# Patient Record
Sex: Female | Born: 1990 | Race: Black or African American | Hispanic: No | Marital: Single | Smoking: Never smoker
Health system: Southern US, Community
[De-identification: ages and names within clinical notes are randomized; demographics above are authoritative.]

## PROBLEM LIST (undated history)

## (undated) ENCOUNTER — Inpatient Hospital Stay (HOSPITAL_COMMUNITY): Payer: Self-pay

## (undated) ENCOUNTER — Emergency Department (HOSPITAL_BASED_OUTPATIENT_CLINIC_OR_DEPARTMENT_OTHER): Admission: EM | Payer: Medicaid Other | Source: Home / Self Care

## (undated) DIAGNOSIS — A749 Chlamydial infection, unspecified: Secondary | ICD-10-CM

## (undated) DIAGNOSIS — D649 Anemia, unspecified: Secondary | ICD-10-CM

## (undated) HISTORY — PX: TIBIA FRACTURE SURGERY: SHX806

---

## 2011-07-15 ENCOUNTER — Emergency Department (INDEPENDENT_AMBULATORY_CARE_PROVIDER_SITE_OTHER): Payer: Self-pay

## 2011-07-15 ENCOUNTER — Encounter: Payer: Self-pay | Admitting: *Deleted

## 2011-07-15 ENCOUNTER — Emergency Department (HOSPITAL_BASED_OUTPATIENT_CLINIC_OR_DEPARTMENT_OTHER)
Admission: EM | Admit: 2011-07-15 | Discharge: 2011-07-15 | Disposition: A | Discharge status: Home / Self Care | Payer: Self-pay | Attending: Emergency Medicine | Admitting: Emergency Medicine

## 2011-07-15 DIAGNOSIS — R1032 Left lower quadrant pain: Secondary | ICD-10-CM

## 2011-07-15 DIAGNOSIS — N83209 Unspecified ovarian cyst, unspecified side: Secondary | ICD-10-CM | POA: Insufficient documentation

## 2011-07-15 DIAGNOSIS — R110 Nausea: Secondary | ICD-10-CM

## 2011-07-15 DIAGNOSIS — N390 Urinary tract infection, site not specified: Secondary | ICD-10-CM | POA: Insufficient documentation

## 2011-07-15 HISTORY — DX: Anemia, unspecified: D64.9

## 2011-07-15 LAB — URINE MICROSCOPIC-ADD ON

## 2011-07-15 LAB — URINALYSIS, ROUTINE W REFLEX MICROSCOPIC
Bilirubin Urine: NEGATIVE
Ketones, ur: NEGATIVE mg/dL
Nitrite: NEGATIVE
Protein, ur: NEGATIVE mg/dL
pH: 6.5 (ref 5.0–8.0)

## 2011-07-15 MED ORDER — SULFAMETHOXAZOLE-TRIMETHOPRIM 800-160 MG PO TABS: 1.0000 | ORAL_TABLET | Freq: Two times a day (BID) | ORAL | Status: AC

## 2011-07-15 MED ORDER — HYDROCODONE-ACETAMINOPHEN 5-500 MG PO TABS: 1.0000 | ORAL_TABLET | Freq: Four times a day (QID) | ORAL | Status: AC | PRN

## 2011-07-15 NOTE — ED Notes (Signed)
Pt c/o generalized abd pain and nausea x 1 week.

## 2011-07-19 ENCOUNTER — Telehealth (HOSPITAL_BASED_OUTPATIENT_CLINIC_OR_DEPARTMENT_OTHER): Payer: Self-pay | Admitting: Emergency Medicine

## 2011-08-11 NOTE — ED Provider Notes (Signed)
History     CSN: [id number] Arrival date & time: 07/15/2011  2:00 PM   None     Chief Complaint  Patient presents with  . Abdominal Pain    (Consider location/radiation/quality/duration/timing/severity/associated sxs/prior treatment) Patient is a 20 y.o. female presenting with abdominal pain. The history is provided by the patient.  Abdominal Pain The primary symptoms of the illness include abdominal pain. The primary symptoms of the illness do not include fever, nausea, vomiting or diarrhea. The current episode started more than 2 days ago. The onset of the illness was gradual. The problem has been gradually worsening.  The patient states that she believes she is currently not pregnant. The patient has not had a change in bowel habit. Symptoms associated with the illness do not include chills, diaphoresis or constipation. Significant associated medical issues do not include PUD, inflammatory bowel disease or diabetes.    Past Medical History  Diagnosis Date  . Anemia     Past Surgical History  Procedure Date  . Joint replacement     History reviewed. No pertinent family history.  History  Substance Use Topics  . Smoking status: Never Smoker   . Smokeless tobacco: Not on file  . Alcohol Use: No    OB History    Grav Para Term Preterm Abortions TAB SAB Ect Mult Living                  Review of Systems  Constitutional: Negative for fever, chills and diaphoresis.  Gastrointestinal: Positive for abdominal pain. Negative for nausea, vomiting, diarrhea and constipation.  All other systems reviewed and are negative.    Allergies  Review of patient's allergies indicates no known allergies.  Home Medications  No current outpatient prescriptions on file.  BP 119/84  Pulse 83  Temp(Src) 98.6 F (37 C) (Oral)  Resp 16  Ht 5\' 8"  (1.727 m)  Wt 175 lb (79.379 kg)  BMI 26.61 kg/m2  SpO2 100%  LMP 06/20/2011  Physical Exam  Nursing note and vitals  reviewed. Constitutional: She is oriented to person, place, and time. She appears well-developed and well-nourished. No distress.  HENT:  Head: Normocephalic and atraumatic.  Neck: Normal range of motion. Neck supple.  Cardiovascular: Normal rate and regular rhythm.  Exam reveals no gallop and no friction rub.   No murmur heard. Pulmonary/Chest: Effort normal and breath sounds normal. No respiratory distress. She has no wheezes.  Abdominal: Soft. Bowel sounds are normal. There is tenderness. There is no rebound and no guarding.  Musculoskeletal: Normal range of motion.  Neurological: She is alert and oriented to person, place, and time.  Skin: Skin is warm and dry. She is not diaphoretic.    ED Course  Procedures (including critical care time)  Labs Reviewed  URINALYSIS, ROUTINE W REFLEX MICROSCOPIC - Abnormal; Notable for the following:    Appearance CLOUDY (*)    Leukocytes, UA SMALL (*)    All other components within normal limits  URINE MICROSCOPIC-ADD ON - Abnormal; Notable for the following:    Squamous Epithelial / LPF MANY (*)    Bacteria, UA MANY (*)    All other components within normal limits  PREGNANCY, URINE   No results found.   1. Abdominal pain, LLQ   2. Ovarian cyst   3. UTI (urinary tract infection)       MDM  Ultrasound shows ovarian cyst, ua looks like uti.  Will discharge with pain meds, antibiotics.  Geoffery Lyons, MD 08/11/11 1328

## 2012-09-30 IMAGING — US US TRANSVAGINAL NON-OB
1 series · 13 of 25 positions shown · non-contrast
Comparison: None.

CLINICAL DATA: Left lower quadrant pain and nausea.  Mirens
inserted [DATE]

TRANSABDOMINAL ULTRASOUND OF PELVIS
TECHNIQUE: Transabdominal ultrasound examination of the pelvis was
performed including evaluation of the uterus, ovaries, adnexal
regions, and pelvic cul-de-sac.

[Series 1: us transvaginal non-ob · 0.30mm/px · 64 acquisitions, 13 frames shown]
[im 1/64]
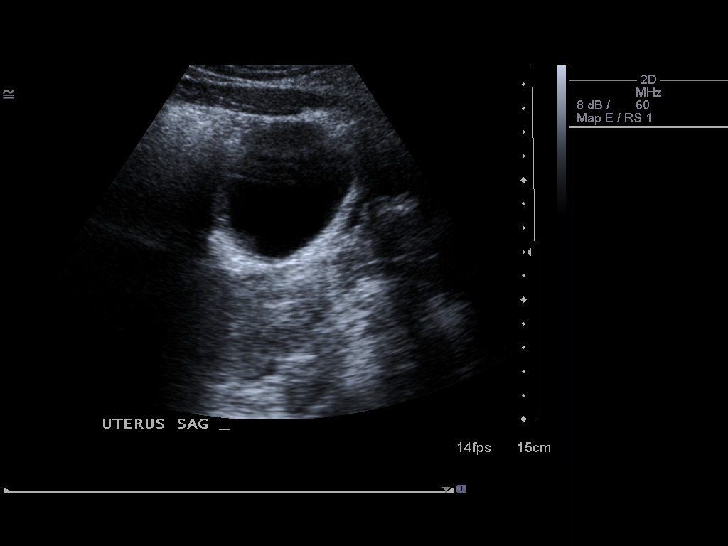
[im 6/64]
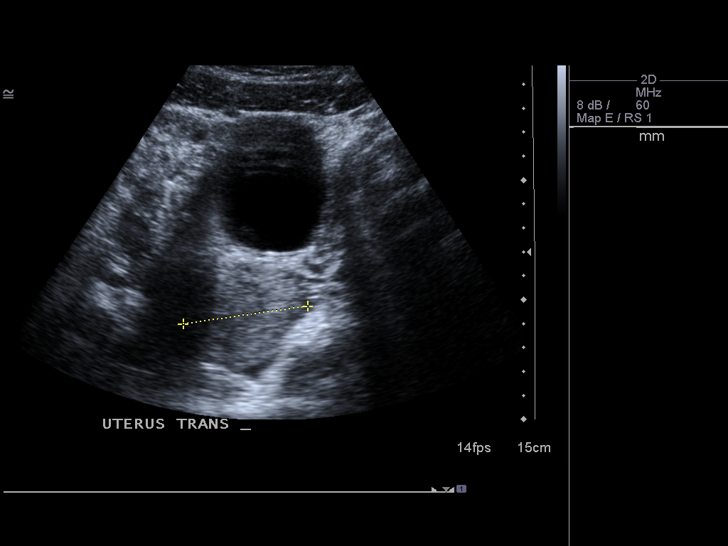
[im 11/64]
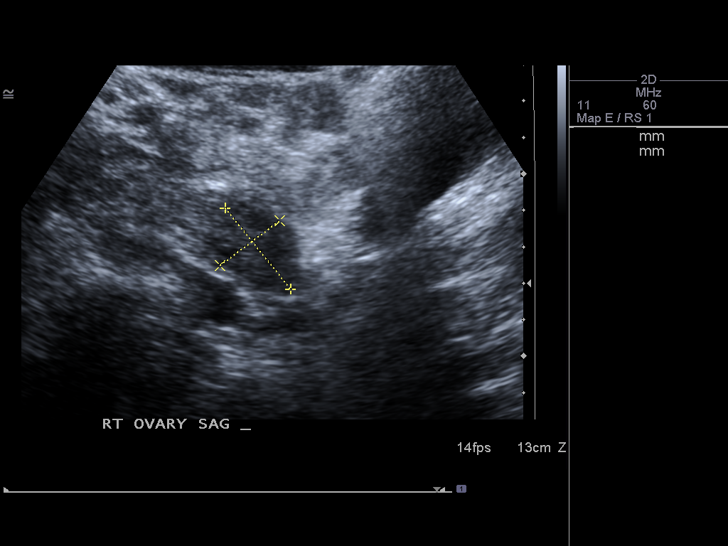
[im 16/64]
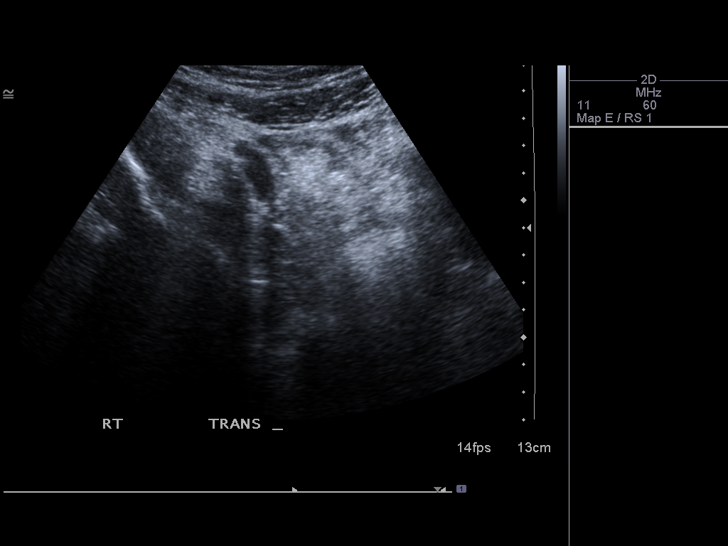
[im 22/64]
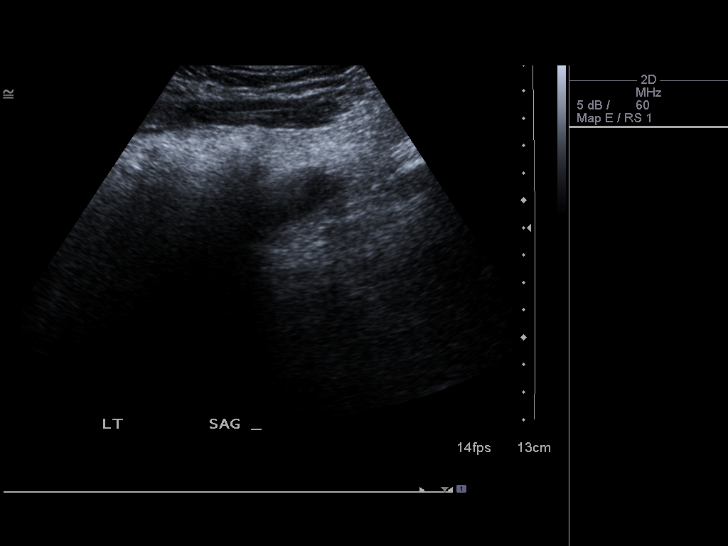
[im 27/64]
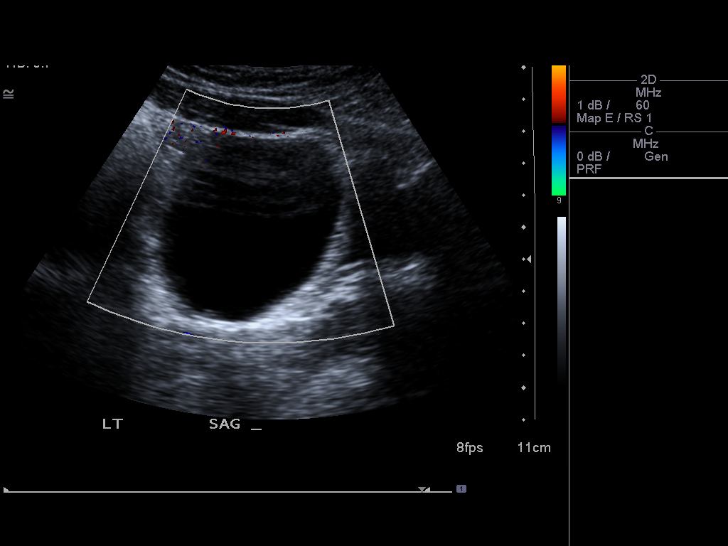
[im 32/64]
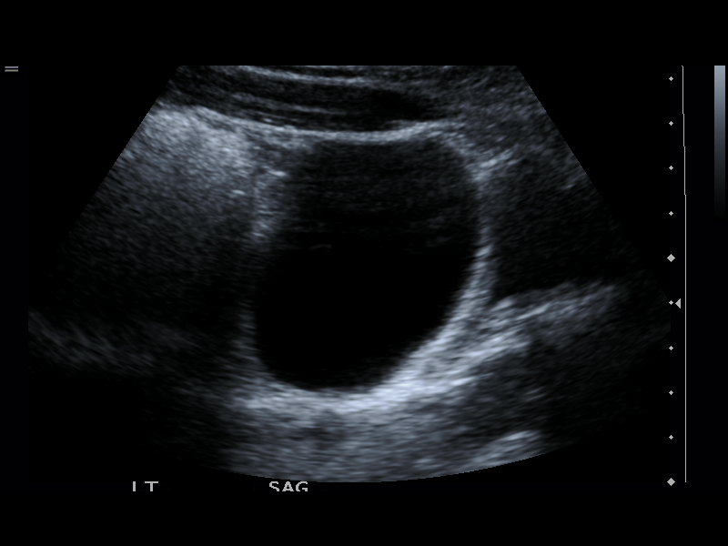
[im 37/64]
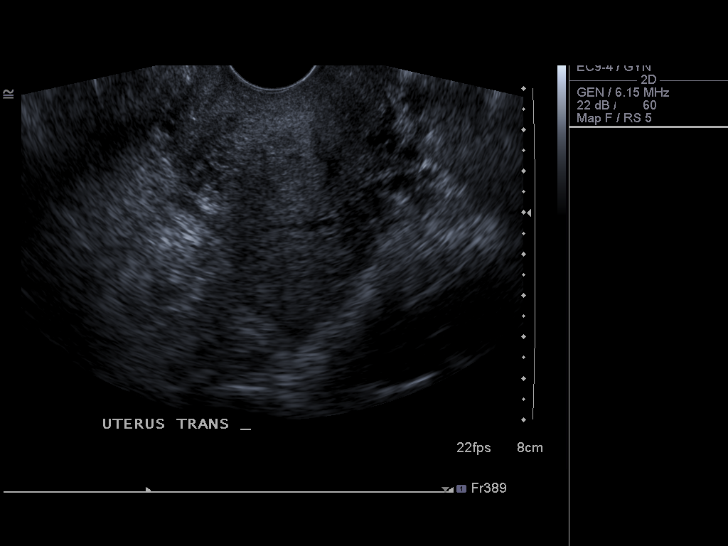
[im 43/64]
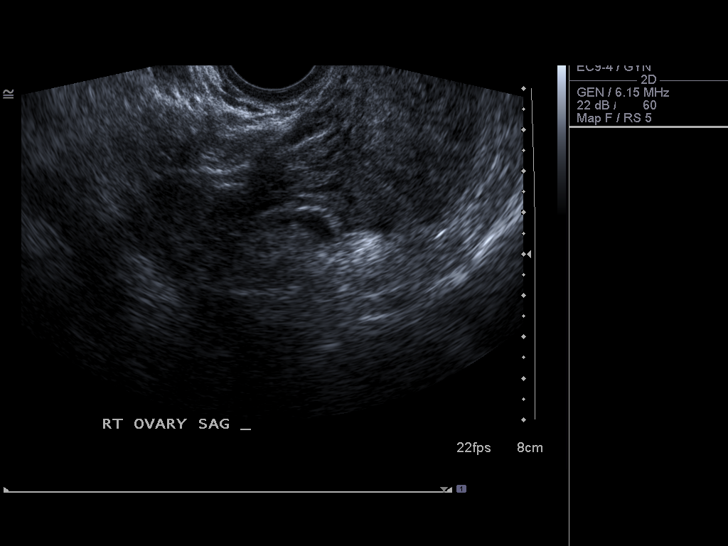
[im 48/64]
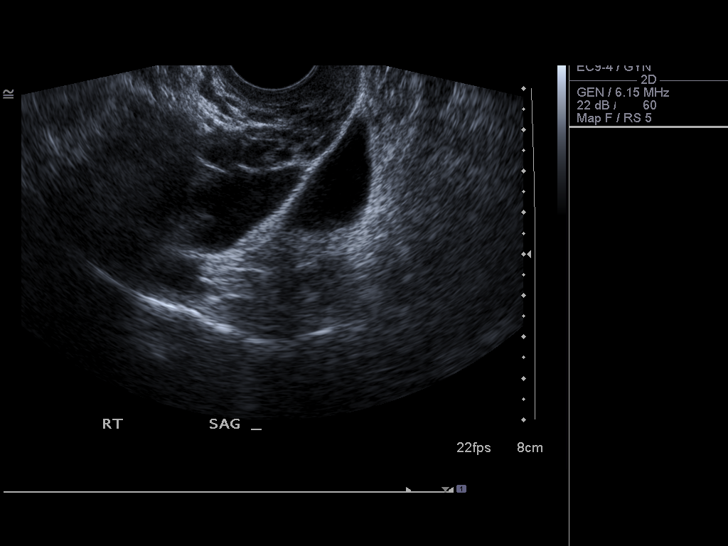
[im 53/64]
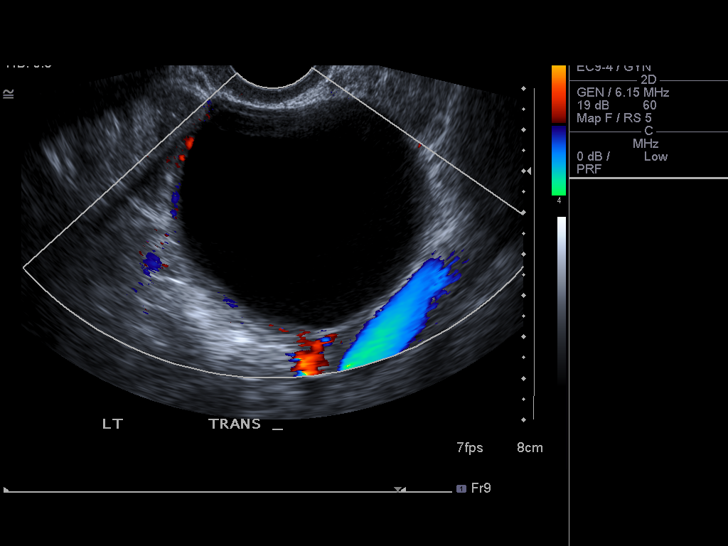
[im 58/64]
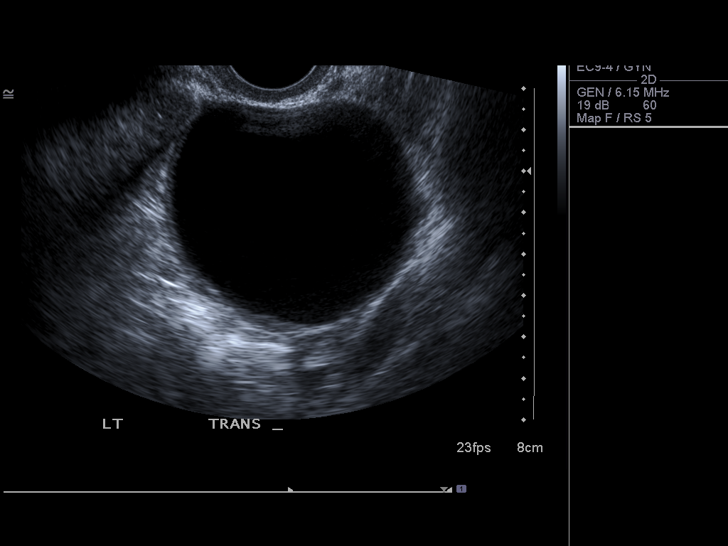
[im 64/64]
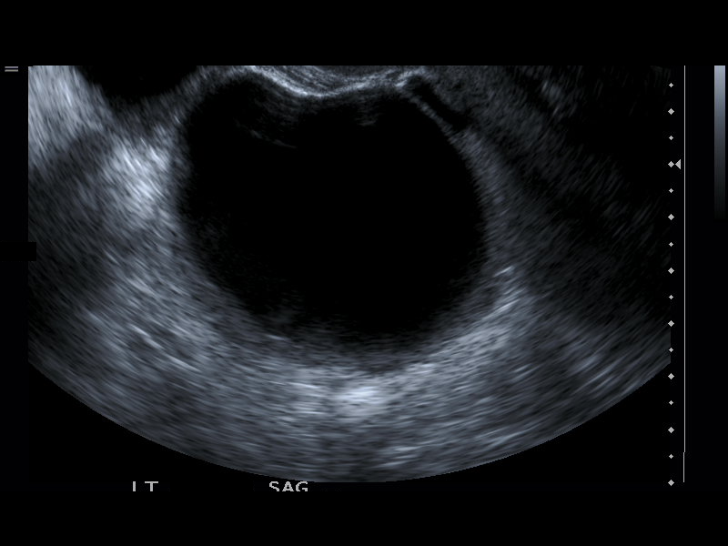

[13 of 25 positions shown; findings below may reference images not displayed]

FINDINGS: Uterus:  Uterus is retroverted measuring 7.9 x 4.2 x 5.5 cm.

Endometrium: Endometrium is normal thickness for a premenopausal
female at 14 mm.  There is an echogenic focus within the
endometrial canal which may relate to contraceptive device.

Right ovary: Normal in size and echogenicity measuring 2.9 x 2.6 x
2.2 cm.

Left ovary:  There is a large anechoic cyst cystic lesion within
the left adnexa measuring 6.5 x 6.2 x 5.4 cm.  The wall is uniform
but minimally thickened.  There is a small amount of peripheral
soft tissue which likely represents the normal ovary.  The lesion
demonstrates mild peripheral vascular flow.

Small amount of intraperitoneal free fluid.
IMPRESSION: 1.  Large benign appearing cyst associated with the left ovary
measures up to 6.5 cm. Recommend follow up imaging in 6 to 12
weeks.

2.  Echogenic focus within the endometrial canal likely represents
contraceptive device.

## 2013-11-16 ENCOUNTER — Inpatient Hospital Stay (HOSPITAL_COMMUNITY)
Admission: AD | Admit: 2013-11-16 | Discharge: 2013-11-16 | Disposition: A | Discharge status: Home / Self Care | Payer: Medicaid Other | Source: Ambulatory Visit | Attending: Obstetrics & Gynecology | Admitting: Obstetrics & Gynecology

## 2013-11-16 ENCOUNTER — Inpatient Hospital Stay (HOSPITAL_COMMUNITY): Payer: Medicaid Other

## 2013-11-16 ENCOUNTER — Encounter (HOSPITAL_COMMUNITY): Payer: Self-pay | Admitting: *Deleted

## 2013-11-16 DIAGNOSIS — O26899 Other specified pregnancy related conditions, unspecified trimester: Secondary | ICD-10-CM

## 2013-11-16 DIAGNOSIS — O99019 Anemia complicating pregnancy, unspecified trimester: Secondary | ICD-10-CM | POA: Insufficient documentation

## 2013-11-16 DIAGNOSIS — O9989 Other specified diseases and conditions complicating pregnancy, childbirth and the puerperium: Principal | ICD-10-CM

## 2013-11-16 DIAGNOSIS — R1032 Left lower quadrant pain: Secondary | ICD-10-CM | POA: Insufficient documentation

## 2013-11-16 DIAGNOSIS — R109 Unspecified abdominal pain: Secondary | ICD-10-CM

## 2013-11-16 DIAGNOSIS — D649 Anemia, unspecified: Secondary | ICD-10-CM | POA: Insufficient documentation

## 2013-11-16 DIAGNOSIS — O99891 Other specified diseases and conditions complicating pregnancy: Secondary | ICD-10-CM | POA: Insufficient documentation

## 2013-11-16 LAB — URINALYSIS, ROUTINE W REFLEX MICROSCOPIC
BILIRUBIN URINE: NEGATIVE
GLUCOSE, UA: NEGATIVE mg/dL
HGB URINE DIPSTICK: NEGATIVE
Ketones, ur: NEGATIVE mg/dL
Nitrite: NEGATIVE
Protein, ur: NEGATIVE mg/dL
UROBILINOGEN UA: 0.2 mg/dL (ref 0.0–1.0)
pH: 6 (ref 5.0–8.0)

## 2013-11-16 LAB — URINE MICROSCOPIC-ADD ON

## 2013-11-16 LAB — CBC
HEMATOCRIT: 29.7 % — AB (ref 36.0–46.0)
HEMOGLOBIN: 9.8 g/dL — AB (ref 12.0–15.0)
MCH: 24.2 pg — ABNORMAL LOW (ref 26.0–34.0)
MCHC: 33 g/dL (ref 30.0–36.0)
MCV: 73.3 fL — ABNORMAL LOW (ref 78.0–100.0)
Platelets: 202 10*3/uL (ref 150–400)
RBC: 4.05 MIL/uL (ref 3.87–5.11)
RDW: 13.9 % (ref 11.5–15.5)
WBC: 5.2 10*3/uL (ref 4.0–10.5)

## 2013-11-16 LAB — WET PREP, GENITAL
TRICH WET PREP: NONE SEEN
YEAST WET PREP: NONE SEEN

## 2013-11-16 LAB — POCT PREGNANCY, URINE: Preg Test, Ur: POSITIVE — AB

## 2013-11-16 LAB — ABO/RH: ABO/RH(D): O POS

## 2013-11-16 LAB — HCG, QUANTITATIVE, PREGNANCY: HCG, BETA CHAIN, QUANT, S: 878 m[IU]/mL — AB (ref <5)

## 2013-11-16 NOTE — MAU Note (Signed)
Pt presents with complaints of pain in her left side states it is next to her ovary. She said she started having problems when she had a mirena but had that removed a while back.

## 2013-11-16 NOTE — Discharge Instructions (Signed)
Continue to take your multivitamin until you get some Prenatal vitamins. No smoking, no drugs, no alcohol.  Take a prenatal vitamin one by mouth every day.  Eat small frequent snacks to avoid nausea.   Come to the clinic downstairs on Tuesday morning at 8 am for repeat lab work.

## 2013-11-16 NOTE — MAU Provider Note (Signed)
History     CSN: [id number]  Arrival date and time: 11/16/13 1413   First Provider Initiated Contact with Patient 11/16/13 1440      Chief Complaint  Patient presents with  . Abdominal Pain   HPI Desiree Rice 23 y.o. [id number]  Client comes to MAU with persistent LLQ pain since Monday.  Notices when she is on her left side for a long time or when she is driving.  No vaginal bleeding.  No other problems voiced.  OB History   Grav Para Term Preterm Abortions TAB SAB Ect Mult Living   2 1 1  0 0 0 0 0 0 1      Past Medical History  Diagnosis Date  . Anemia     Past Surgical History  Procedure Laterality Date  . Joint replacement      No family history on file.  History  Substance Use Topics  . Smoking status: Never Smoker   . Smokeless tobacco: Not on file  . Alcohol Use: No    Allergies: No Known Allergies  Prescriptions prior to admission  Medication Sig Dispense Refill  . Multiple Vitamin (MULTIVITAMIN WITH MINERALS) TABS tablet Take 1 tablet by mouth daily.        Review of Systems  Constitutional: Negative for fever.  Gastrointestinal: Positive for abdominal pain. Negative for nausea and vomiting.  Genitourinary:       No vaginal discharge. No vaginal bleeding. No dysuria.   Physical Exam   Blood pressure 118/70, pulse 101, temperature 98.7 F (37.1 C), resp. rate 18, last menstrual period 09/29/2013.  Physical Exam  Nursing note and vitals reviewed. Constitutional: She is oriented to person, place, and time. She appears well-developed and well-nourished.  HENT:  Head: Normocephalic.  Eyes: EOM are normal.  Neck: Neck supple.  GI: Soft. There is no tenderness.  Genitourinary:  Speculum exam: Vulva - Clear Vagina - Very small amount of creamy discharge, no odor Cervix - No contact bleeding Bimanual exam: Cervix closed Uterus non tender, unable to size well due to habitus Adnexa non tender, no masses bilaterally GC/Chlam, wet prep  done Chaperone present for exam.  Musculoskeletal: Normal range of motion.  Neurological: She is alert and oriented to person, place, and time.  Skin: Skin is warm and dry.  Psychiatric: She has a normal mood and affect.    MAU Course  Procedures  MDM Results for orders placed during the hospital encounter of 11/16/13 (from the past 24 hour(s))  URINALYSIS, ROUTINE W REFLEX MICROSCOPIC     Status: Abnormal   Collection Time    11/16/13  2:22 PM      Result Value Range   Color, Urine YELLOW  YELLOW   APPearance HAZY (*) CLEAR   Specific Gravity, Urine >1.030 (*) 1.005 - 1.030   pH 6.0  5.0 - 8.0   Glucose, UA NEGATIVE  NEGATIVE mg/dL   Hgb urine dipstick NEGATIVE  NEGATIVE   Bilirubin Urine NEGATIVE  NEGATIVE   Ketones, ur NEGATIVE  NEGATIVE mg/dL   Protein, ur NEGATIVE  NEGATIVE mg/dL   Urobilinogen, UA 0.2  0.0 - 1.0 mg/dL   Nitrite NEGATIVE  NEGATIVE   Leukocytes, UA SMALL (*) NEGATIVE  URINE MICROSCOPIC-ADD ON     Status: Abnormal   Collection Time    11/16/13  2:22 PM      Result Value Range   Squamous Epithelial / LPF MANY (*) RARE   WBC, UA 3-6  <3 WBC/hpf  RBC / HPF 0-2  <3 RBC/hpf   Bacteria, UA MANY (*) RARE  POCT PREGNANCY, URINE     Status: Abnormal   Collection Time    11/16/13  2:25 PM      Result Value Range   Preg Test, Ur POSITIVE (*) NEGATIVE  HCG, QUANTITATIVE, PREGNANCY     Status: Abnormal   Collection Time    11/16/13  2:49 PM      Result Value Range   hCG, Beta Chain, Quant, S 878 (*) <5 mIU/mL  CBC     Status: Abnormal   Collection Time    11/16/13  2:49 PM      Result Value Range   WBC 5.2  4.0 - 10.5 K/uL   RBC 4.05  3.87 - 5.11 MIL/uL   Hemoglobin 9.8 (*) 12.0 - 15.0 g/dL   HCT 16.1 (*) 09.6 - 04.5 %   MCV 73.3 (*) 78.0 - 100.0 fL   MCH 24.2 (*) 26.0 - 34.0 pg   MCHC 33.0  30.0 - 36.0 g/dL   RDW 40.9  81.1 - 91.4 %   Platelets 202  150 - 400 K/uL  ABO/RH     Status: None   Collection Time    11/16/13  2:49 PM      Result Value  Range   ABO/RH(D) O POS    WET PREP, GENITAL     Status: Abnormal   Collection Time    11/16/13  3:00 PM      Result Value Range   Yeast Wet Prep HPF POC NONE SEEN  NONE SEEN   Trich, Wet Prep NONE SEEN  NONE SEEN   Clue Cells Wet Prep HPF POC FEW (*) NONE SEEN   WBC, Wet Prep HPF POC FEW (*) NONE SEEN   CLINICAL DATA: Left lower quadrant pain.  EXAM:  OBSTETRIC <14 WK [place] AND TRANSVAGINAL OB US  TECHNIQUE:  Both transabdominal and transvaginal ultrasound examinations were  performed for complete evaluation of the gestation as well as the  maternal uterus, adnexal regions, and pelvic cul-de-sac.  Transvaginal technique was performed to assess early pregnancy.  COMPARISON: Ultrasound 07/15/2011.  FINDINGS:  Intrauterine gestational sac: Very questionable intrauterine  gestational sac measuring 2.6 mm in maximum diameter. Adjacent  ill-defined 11 mm maximum diameter hypoechoic region noted. This  could represent a tiny subchorionic hemorrhage.  Yolk sac: No.  Embryo: No.  Cardiac Activity: Not applicable.  Heart Rate: Not applicable.  MSD: 0.24 cm mm 4 weeks w 5 day. D  CRL: mm w d [place] EDC:  Maternal uterus/adnexae: Tiny subchorionic hemorrhage cannot be  excluded. Uterus and adnexa otherwise unremarkable.  IMPRESSION:  Findings suggesting very early 4 week 5 day intrauterine pregnancy.  A tiny subchorionic hemorrhage may be present. Serial ultrasounds  suggested.    Assessment and Plan  Abdominal pain in early pregnancy Anemia  Plan Needs repeat quant in 48 hours.  Will make an appointment in the GYN clinic. Client will return on Tuesday Morning at 8 am for repeat quant.  BURLESON,TERRI 11/16/2013, 2:45 PM

## 2013-11-17 LAB — URINE CULTURE

## 2013-11-18 LAB — GC/CHLAMYDIA PROBE AMP
CT PROBE, AMP APTIMA: NEGATIVE
GC PROBE AMP APTIMA: NEGATIVE

## 2013-11-19 ENCOUNTER — Inpatient Hospital Stay (HOSPITAL_COMMUNITY): Payer: Medicaid Other

## 2013-11-19 ENCOUNTER — Other Ambulatory Visit: Payer: Medicaid Other

## 2013-11-19 ENCOUNTER — Inpatient Hospital Stay (HOSPITAL_COMMUNITY)
Admission: AD | Admit: 2013-11-19 | Discharge: 2013-11-19 | Disposition: A | Discharge status: Home / Self Care | Payer: Medicaid Other | Source: Ambulatory Visit | Attending: Obstetrics & Gynecology | Admitting: Obstetrics & Gynecology

## 2013-11-19 ENCOUNTER — Encounter (HOSPITAL_COMMUNITY): Payer: Self-pay | Admitting: *Deleted

## 2013-11-19 DIAGNOSIS — R1032 Left lower quadrant pain: Secondary | ICD-10-CM | POA: Insufficient documentation

## 2013-11-19 DIAGNOSIS — R109 Unspecified abdominal pain: Principal | ICD-10-CM

## 2013-11-19 DIAGNOSIS — O26859 Spotting complicating pregnancy, unspecified trimester: Secondary | ICD-10-CM | POA: Insufficient documentation

## 2013-11-19 DIAGNOSIS — O26899 Other specified pregnancy related conditions, unspecified trimester: Secondary | ICD-10-CM

## 2013-11-19 DIAGNOSIS — O0281 Inappropriate change in quantitative human chorionic gonadotropin (hCG) in early pregnancy: Secondary | ICD-10-CM

## 2013-11-19 HISTORY — DX: Chlamydial infection, unspecified: A74.9

## 2013-11-19 LAB — CBC
HEMATOCRIT: 30.9 % — AB (ref 36.0–46.0)
Hemoglobin: 10.2 g/dL — ABNORMAL LOW (ref 12.0–15.0)
MCH: 24.3 pg — AB (ref 26.0–34.0)
MCHC: 33 g/dL (ref 30.0–36.0)
MCV: 73.6 fL — AB (ref 78.0–100.0)
PLATELETS: 157 10*3/uL (ref 150–400)
RBC: 4.2 MIL/uL (ref 3.87–5.11)
RDW: 14 % (ref 11.5–15.5)
WBC: 6 10*3/uL (ref 4.0–10.5)

## 2013-11-19 LAB — HCG, QUANTITATIVE, PREGNANCY: hCG, Beta Chain, Quant, S: 1163 m[IU]/mL — ABNORMAL HIGH (ref <5)

## 2013-11-19 NOTE — Discharge Instructions (Signed)
Ectopic Pregnancy °An ectopic pregnancy is when the fertilized egg attaches (implants) outside the uterus. Most ectopic pregnancies occur in the fallopian tube. Rarely do ectopic pregnancies occur on the ovary, intestine, pelvis, or cervix. In an ectopic pregnancy, the fertilized egg does not have the ability to develop into a normal, healthy baby.  °A ruptured ectopic pregnancy is one in which the fallopian tube gets torn or bursts and results in internal bleeding. Often there is intense abdominal pain, and sometimes, vaginal bleeding. Having an ectopic pregnancy can be life threatening. If left untreated, this dangerous condition can lead to a blood transfusion, abdominal surgery, or even death. °CAUSES  °Damage to the fallopian tubes is the suspected cause in most ectopic pregnancies.  °RISK FACTORS °Depending on your circumstances, the risk of having an ectopic pregnancy will vary. The level of risk can be divided into three categories. °High Risk °· You have gone through infertility treatment. °· You have had a previous ectopic pregnancy. °· You have had previous tubal surgery. °· You have had previous surgery to have the fallopian tubes tied (tubal ligation). °· You have tubal problems or diseases. °· You have been exposed to DES. DES is a medicine that was used until 1971 and had effects on babies whose mothers took the medicine. °· You become pregnant while using an intrauterine device (IUD) for birth control.  °Moderate Risk °· You have a history of infertility. °· You have a history of a sexually transmitted infection (STI). °· You have a history of pelvic inflammatory disease (PID). °· You have scarring from endometriosis. °· You have multiple sexual partners. °· You smoke.  °Low Risk °· You have had previous pelvic surgery. °· You use vaginal douching. °· You became sexually active before 25 years of age. °SIGNS AND SYMPTOMS  °An ectopic pregnancy should be suspected in anyone who has missed a period and  has abdominal pain or bleeding. °· You may experience normal pregnancy symptoms, such as: °· Nausea. °· Tiredness. °· Breast tenderness. °· Other symptoms may include: °· Pain with intercourse. °· Irregular vaginal bleeding or spotting. °· Cramping or pain on one side or in the lower abdomen. °· Fast heartbeat. °· Passing out while having a bowel movement. °· Symptoms of a ruptured ectopic pregnancy and internal bleeding may include: °· Sudden, severe pain in the abdomen and pelvis. °· Dizziness or fainting. °· Pain in the shoulder area. °DIAGNOSIS  °Tests that may be performed include: °· A pregnancy test. °· An ultrasound test. °· Testing the specific level of pregnancy hormone in the bloodstream. °· Taking a sample of uterus tissue (dilation and curettage, D&C). °· Surgery to perform a visual exam of the inside of the abdomen using a thin, lighted tube with a tiny camera on the end (laparoscope). °TREATMENT  °An injection of a medicine called methotrexate may be given. This medicine causes the pregnancy tissue to be absorbed. It is given if: °· The diagnosis is made early. °· The fallopian tube has not ruptured. °· You are considered to be a good candidate for the medicine. °Usually, pregnancy hormone blood levels are checked after methotrexate treatment. This is to be sure the medicine is effective. It may take 4 6 weeks for the pregnancy to be absorbed (though most pregnancies will be absorbed by 3 weeks). °Surgical treatment may be needed. A laparoscope may be used to remove the pregnancy tissue. If severe internal bleeding occurs, a cut (incision) may be made in the lower abdomen (laparotomy), and the   ectopic pregnancy is removed. This stops the bleeding. Part of the fallopian tube, or the whole tube, may be removed as well (salpingectomy). After surgery, pregnancy hormone tests may be done to be sure there is no pregnancy tissue left. You may receive an Rho(D) immune globulin shot if you are Rh negative and  the father is Rh positive, or if you do not know the Rh type of the father. This is to prevent problems with any future pregnancy. °SEEK IMMEDIATE MEDICAL CARE IF:  °You have any symptoms of an ectopic pregnancy. This is a medical emergency. °Document Released: 11/17/2004 Document Revised: 07/31/2013 Document Reviewed: 05/09/2013 °Exit[employer]® Patient Information ©2014 [employer]. ° °

## 2013-11-19 NOTE — MAU Provider Note (Signed)
Chief Complaint: Vaginal Discharge   First Provider Initiated Contact with Patient 11/19/13 1843     SUBJECTIVE HPI: Desiree Rice is a 23 y.o. G2P1001 at [redacted]w[redacted]d by LMP who presents to maternity admissions reporting light brown vaginal spotting.  Pt was seen in MAU 11/16/13 for abdominal pain and had f/u appointment for repeat quant hcg in clinic today.  Her ultrasound showed possible gestational sac with no yolk sac and quant hcg of 878 on 1/24.  She had new onset spotting starting this afternoon so came to MAU.  She denies abdominal pain, vaginal itching/burning, urinary symptoms, h/a, dizziness, n/v, or fever/chills.     Past Medical History  Diagnosis Date  . Anemia   . Chlamydia    Past Surgical History  Procedure Laterality Date  . Tibia fracture surgery     History   Social History  . Marital Status: Single    Spouse Name: N/A    Number of Children: N/A  . Years of Education: N/A   Occupational History  . Not on file.   Social History Main Topics  . Smoking status: Never Smoker   . Smokeless tobacco: Never Used  . Alcohol Use: No  . Drug Use: Not on file  . Sexual Activity: No   Other Topics Concern  . Not on file   Social History Narrative  . No narrative on file   No current facility-administered medications on file prior to encounter.   No current outpatient prescriptions on file prior to encounter.   No Known Allergies  ROS: Pertinent items in HPI  OBJECTIVE Blood pressure 129/93, pulse 102, temperature 97.9 F (36.6 C), temperature source Oral, resp. rate 18, height 5\' 8"  (1.727 m), weight 85.73 kg (189 lb), last menstrual period 09/29/2013. GENERAL: Well-developed, well-nourished female in no acute distress.  HEENT: Normocephalic HEART: normal rate RESP: normal effort ABDOMEN: Soft, non-tender EXTREMITIES: Nontender, no edema NEURO: Alert and oriented Pelvic exam: Cervix pink, visually closed, friable cervix with cotton swab, scant clear mucus  discharge, vaginal walls and external genitalia normal  LAB RESULTS Results for orders placed during the hospital encounter of 11/19/13 (from the past 24 hour(s))  HCG, QUANTITATIVE, PREGNANCY     Status: Abnormal   Collection Time    11/19/13  7:01 PM      Result Value Range   hCG, Beta Chain, Quant, S 1163 (*) <5 mIU/mL  CBC     Status: Abnormal   Collection Time    11/19/13  7:01 PM      Result Value Range   WBC 6.0  4.0 - 10.5 K/uL   RBC 4.20  3.87 - 5.11 MIL/uL   Hemoglobin 10.2 (*) 12.0 - 15.0 g/dL   HCT 16.1 (*) 09.6 - 04.5 %   MCV 73.6 (*) 78.0 - 100.0 fL   MCH 24.3 (*) 26.0 - 34.0 pg   MCHC 33.0  30.0 - 36.0 g/dL   RDW 40.9  81.1 - 91.4 %   Platelets 157  150 - 400 K/uL    IMAGING US Ob Transvaginal  11/16/2013   CLINICAL DATA:  Left lower quadrant pain.  EXAM: OBSTETRIC <14 WK [place] AND TRANSVAGINAL OB US  TECHNIQUE: Both transabdominal and transvaginal ultrasound examinations were performed for complete evaluation of the gestation as well as the maternal uterus, adnexal regions, and pelvic cul-de-sac. Transvaginal technique was performed to assess early pregnancy.  COMPARISON:  Ultrasound 07/15/2011.  FINDINGS: Intrauterine gestational sac: Very questionable intrauterine gestational sac measuring 2.6  mm in maximum diameter. Adjacent ill-defined 11 mm maximum diameter hypoechoic region noted. This could represent a tiny subchorionic hemorrhage.  Yolk sac:  No.  Embryo:  No.  Cardiac Activity: Not applicable.  Heart Rate:  Not applicable.  MSD:  0.24 cm  mm   4 weeks w   5 day.  D  CRL:     mm    w  d                  US EDC:  Maternal uterus/adnexae: Tiny subchorionic hemorrhage cannot be excluded. Uterus and adnexa otherwise unremarkable.  IMPRESSION: Findings suggesting very early 4 week 5 day intrauterine pregnancy. A tiny subchorionic hemorrhage may be present. Serial ultrasounds suggested.   Electronically Signed   By: Maisie Fushomas  Register   On: 11/16/2013 16:38   [place] Ob  Transvaginal  11/19/2013   CLINICAL DATA:  Abnormal rise an quantitative beta HCG. Assess fetal viability.  EXAM: TRANSVAGINAL OB ULTRASOUND  TECHNIQUE: Transvaginal ultrasound was performed for complete evaluation of the gestation as well as the maternal uterus, adnexal regions, and pelvic cul-de-sac.  COMPARISON:  Three days earlier.  FINDINGS: Intrauterine gestational sac: Visualized/normal in shape.  Yolk sac:  None.  Embryo:  None.  Cardiac Activity: None.  MSD: 3.6  mm   4 w   6  d  US EDC: 07/23/2014  Maternal uterus/adnexae: Small subchorionic hemorrhage. The ovaries appear within normal limits. Trace fluid in the anatomic pelvis.  IMPRESSION: Growth of intrauterine gestational sac compared to the prior exam 3 days earlier, now measuring 3.6 mm (previous mean sac diameter 2.4 mm). Small subchorionic hemorrhage.  Probable early intrauterine gestational sac, but no yolk sac, fetal pole, or cardiac activity yet visualized. Recommend follow-up quantitative B-HCG levels and follow-up US in 14 days to confirm and assess viability. This recommendation follows SRU consensus guidelines: Diagnostic Criteria for Nonviable Pregnancy Early in the First Trimester. Malva Limes Engl J Med 2013; 409:8119-14; 369:1443-51.   Electronically Signed   By: Andreas NewportGeoffrey  Lamke M.D.   On: 11/19/2013 20:56    ASSESSMENT 1. Inappropriate change in quantitative hCG in early pregnancy     PLAN Consult Dr Debroah LoopArnold Discharge home Return to MAU in 48 hours for repeat quant hcg Return sooner as needed    Medication List         prenatal multivitamin Tabs tablet  Take 1 tablet by mouth daily at 12 noon.       Follow-up Information   [facility] (Return for repeat labs on Thursday, 11/21/13.  Return to MAU sooner as needed.  )    Contact information:   [address] [id number] [facility] [place] [id number] [phone]      Sharen CounterLisa Leftwich-Kirby Certified Nurse-Midwife 11/19/2013   9:14 PM

## 2013-11-19 NOTE — Progress Notes (Signed)
BHCG drawn. Patient to lobby to wait for results.

## 2013-11-19 NOTE — MAU Note (Signed)
States she came for F/U as instructed this AM. Since then has noticed some brown spotting. No pain.

## 2013-11-20 LAB — HCG, QUANTITATIVE, PREGNANCY: hCG, Beta Chain, Quant, S: 1552.1 m[IU]/mL

## 2013-11-21 ENCOUNTER — Encounter (HOSPITAL_COMMUNITY): Payer: Self-pay | Admitting: Family

## 2013-11-21 ENCOUNTER — Inpatient Hospital Stay (HOSPITAL_COMMUNITY)
Admission: AD | Admit: 2013-11-21 | Discharge: 2013-11-21 | Disposition: A | Discharge status: Home / Self Care | Payer: Medicaid Other | Source: Ambulatory Visit | Attending: Obstetrics & Gynecology | Admitting: Obstetrics & Gynecology

## 2013-11-21 DIAGNOSIS — O039 Complete or unspecified spontaneous abortion without complication: Secondary | ICD-10-CM

## 2013-11-21 DIAGNOSIS — O034 Incomplete spontaneous abortion without complication: Secondary | ICD-10-CM

## 2013-11-21 DIAGNOSIS — R109 Unspecified abdominal pain: Secondary | ICD-10-CM | POA: Insufficient documentation

## 2013-11-21 DIAGNOSIS — O200 Threatened abortion: Secondary | ICD-10-CM | POA: Insufficient documentation

## 2013-11-21 LAB — HCG, QUANTITATIVE, PREGNANCY: hCG, Beta Chain, Quant, S: 740 m[IU]/mL — ABNORMAL HIGH (ref <5)

## 2013-11-21 NOTE — MAU Provider Note (Signed)
  History     CSN: [id number]  Arrival date and time: 11/21/13 0931   None     Chief Complaint  Patient presents with  . Vaginal Bleeding   HPI Delton Coombesicole P Hartwig is a 23 y.o. G2P1001 at 5234w4d who presented to MAU c/o continued vaginal spotting since last MAU visit on 1/27.  Steady light flow, not soaking through pads, ranges from bright red to brown in color.  Advised to return in 48 hours for BHCG.  This morning, patient is experiencing abdominal cramping (intermittent dull ache) which started around 8:30 a.m.  No meds for pain.  Denies vaginal discharge, fevers, nausea, vomiting, constipation, diarrhea, SOB, chest pain.  Reports no complications with previous pregnancy.  Past Medical History  Diagnosis Date  . Anemia   . Chlamydia     Past Surgical History  Procedure Laterality Date  . Tibia fracture surgery      History reviewed. No pertinent family history.  History  Substance Use Topics  . Smoking status: Never Smoker   . Smokeless tobacco: Never Used  . Alcohol Use: No    Allergies: No Known Allergies  Prescriptions prior to admission  Medication Sig Dispense Refill  . Prenatal Vit-Fe Fumarate-FA (PRENATAL MULTIVITAMIN) TABS tablet Take 1 tablet by mouth daily at 12 noon.        Review of Systems  All other systems reviewed and are negative.   Physical Exam   Blood pressure 110/78, pulse 84, temperature 98.2 F (36.8 C), temperature source Oral, resp. rate 18, height 5\' 9"  (1.753 m), weight 84.369 kg (186 lb), last menstrual period 09/29/2013.  Physical Exam  Nursing note and vitals reviewed. Constitutional: She is oriented to person, place, and time. She appears well-developed and well-nourished. No distress.  HENT:  Head: Normocephalic and atraumatic.  Cardiovascular: Normal rate.   Respiratory: Effort normal. No respiratory distress.  GI: Soft. She exhibits no distension and no mass. There is no tenderness. There is no rebound and no guarding.   Neurological: She is alert and oriented to person, place, and time.  Skin: Skin is warm and dry. No rash noted.  Psychiatric: She has a normal mood and affect. Her behavior is normal.    MAU Course  Procedures  MDM 1/24 MAU - 878 BHCG; possible gestational sac with no yolk sac on US with [facility].   1/27 MAU - 1163 BHCG; enlarging gestational sac, yolk sac still not visualized.  [facility]   1[facility]29 [facility]U - 740 BHCG.  Assessment and Plan  #Inevitable AB - Consulted Dr. Debroah LoopArnold - Follow BHCG - RTC on Monday for quant  TUCKER, BRITTON L 11/21/2013, 10:25 AM   Seen by me also.  Will repeat Quant in 4 days on Monday Discussed miscarriage.  Follow quants in clinic.  Aviva SignsMarie L Levaeh Vice, CNM

## 2013-11-21 NOTE — Discharge Instructions (Signed)
Incomplete Miscarriage °A miscarriage is the sudden loss of an unborn baby (fetus) before the 20th week of pregnancy. In an incomplete miscarriage, parts of the fetus or placenta (afterbirth) remain in the body.  °Having a miscarriage can be an emotional experience. Talk with your health care provider about any questions you may have about miscarrying, the grieving process, and your future pregnancy plans. °CAUSES  °· Problems with the fetal chromosomes that make it impossible for the baby to develop normally. Problems with the baby's genes or chromosomes are most often the result of errors that occur by chance as the embryo divides and grows. The problems are not inherited from the parents. °· Infection of the cervix or uterus. °· Hormone problems. °· Problems with the cervix, such as having an incompetent cervix. This is when the tissue in the cervix is not strong enough to hold the pregnancy. °· Problems with the uterus, such as an abnormally shaped uterus, uterine fibroids, or congenital abnormalities. °· Certain medical conditions. °· Smoking, drinking alcohol, or taking illegal drugs. °· Trauma. °SYMPTOMS  °· Vaginal bleeding or spotting, with or without cramps or pain. °· Pain or cramping in the abdomen or lower back. °· Passing fluid, tissue, or blood clots from the vagina. °DIAGNOSIS  °Your health care provider will perform a physical exam. You may also have an ultrasound to confirm the miscarriage. Blood or urine tests may also be ordered. °TREATMENT  °· Usually, a dilation and curettage (D&C) procedure is performed. During a D&C procedure, the cervix is widened (dilated) and any remaining fetal or placental tissue is gently removed from the uterus. °· Antibiotic medicines are prescribed if there is an infection. Other medicines may be given to reduce the size of the uterus (contract) if there is a lot of bleeding. °· If you have Rh negative blood and your baby was Rh positive, you will need an Rho(D)  immune globulin shot. This shot will protect any future baby from having Rh blood problems in future pregnancies. °· You may be confined to bed rest. This means you should stay in bed and only get up to use the bathroom. °HOME CARE INSTRUCTIONS  °· Rest as directed by your health care provider. °· Restrict activity as directed by your health care provider. You may be allowed to continue light activity if curettage was not done but you require further treatment. °· Keep track of the number of pads you use each day. Keep track of how soaked (saturated) they are. Record this information. °· Do not  use tampons. °· Do not douche or have sexual intercourse until approved by your health care provider. °· Keep all follow-up appointments for re-evaluation and continuing management. °· Only take over-the-counter or prescription medicines for pain, fever, or discomfort as directed by your health care provider. °· Take antibiotic medicine as directed by your health care provider. Make sure you finish it even if you start to feel better. °SEEK IMMEDIATE MEDICAL CARE IF:  °· You experience severe cramps in your stomach, back, or abdomen. °· You have an unexplained temperature (make sure to record these temperatures). °· You pass large clots or tissue (save these for your health care provider to inspect). °· Your bleeding increases. °· You become light-headed, weak, or have fainting episodes. °MAKE SURE YOU:  °· Understand these instructions. °· Will watch your condition. °· Will get help right away if you are not doing well or get worse. °Document Released: 10/10/2005 Document Revised: 07/31/2013 Document Reviewed: 05/09/2013 °  ExitCare® Patient Information ©2014 [employer]. ° °

## 2013-11-21 NOTE — MAU Note (Signed)
After she was seen, bleeding changed and became brown, and is seeing some tissue.  About an hour ago was having cramping in low back and lower part of stomach, no longer feeling the cramping.

## 2013-11-21 NOTE — MAU Note (Signed)
23 yo, G2P1 at 1548w4d, presenting with c/o vaginal bleeding x 2 days; not soaking a pad. Wore one pad through entire day yesterday.  Reports lower abdominal cramping since 0830 today; wearing pad from home not saturated, reports some clots. No pain meds in last 24 hrs.

## 2013-11-21 NOTE — MAU Provider Note (Signed)

## 2013-11-25 ENCOUNTER — Other Ambulatory Visit: Payer: Self-pay

## 2013-11-29 ENCOUNTER — Telehealth: Payer: Self-pay | Admitting: *Deleted

## 2013-11-29 NOTE — Telephone Encounter (Signed)
Patient was scheduled for a beta Hcg on Monday 11/25/13 at 8 am.  Patient did not keep appointment.  Phoned patient at above number.  No answer.  Left voicemail message that I was calling to follow up on the appointment for lab work that she missed on Monday.  I explained I had spoken to the doctor (Dr. Debroah LoopArnold) and that we would like her to go to the Maternity Admissions Department [facility] so we can follow up on the lab work she missed on Monday.

## 2014-02-24 ENCOUNTER — Emergency Department (HOSPITAL_BASED_OUTPATIENT_CLINIC_OR_DEPARTMENT_OTHER): Payer: Medicaid Other

## 2014-02-24 ENCOUNTER — Encounter (HOSPITAL_BASED_OUTPATIENT_CLINIC_OR_DEPARTMENT_OTHER): Payer: Self-pay | Admitting: Emergency Medicine

## 2014-02-24 ENCOUNTER — Emergency Department (HOSPITAL_BASED_OUTPATIENT_CLINIC_OR_DEPARTMENT_OTHER)
Admission: EM | Admit: 2014-02-24 | Discharge: 2014-02-24 | Disposition: A | Discharge status: Home / Self Care | Payer: Medicaid Other | Attending: Emergency Medicine | Admitting: Emergency Medicine

## 2014-02-24 DIAGNOSIS — D649 Anemia, unspecified: Secondary | ICD-10-CM | POA: Insufficient documentation

## 2014-02-24 DIAGNOSIS — R072 Precordial pain: Secondary | ICD-10-CM | POA: Insufficient documentation

## 2014-02-24 DIAGNOSIS — R110 Nausea: Secondary | ICD-10-CM | POA: Insufficient documentation

## 2014-02-24 DIAGNOSIS — Z8619 Personal history of other infectious and parasitic diseases: Secondary | ICD-10-CM | POA: Insufficient documentation

## 2014-02-24 DIAGNOSIS — Z8719 Personal history of other diseases of the digestive system: Secondary | ICD-10-CM | POA: Insufficient documentation

## 2014-02-24 DIAGNOSIS — R0602 Shortness of breath: Secondary | ICD-10-CM | POA: Insufficient documentation

## 2014-02-24 DIAGNOSIS — R079 Chest pain, unspecified: Secondary | ICD-10-CM

## 2014-02-24 DIAGNOSIS — Z79899 Other long term (current) drug therapy: Secondary | ICD-10-CM | POA: Insufficient documentation

## 2014-02-24 LAB — CBC WITH DIFFERENTIAL/PLATELET
BASOS ABS: 0 10*3/uL (ref 0.0–0.1)
Basophils Relative: 0 % (ref 0–1)
EOS PCT: 2 % (ref 0–5)
Eosinophils Absolute: 0.1 10*3/uL (ref 0.0–0.7)
HCT: 32.7 % — ABNORMAL LOW (ref 36.0–46.0)
Hemoglobin: 10.7 g/dL — ABNORMAL LOW (ref 12.0–15.0)
LYMPHS ABS: 2.5 10*3/uL (ref 0.7–4.0)
Lymphocytes Relative: 51 % — ABNORMAL HIGH (ref 12–46)
MCH: 24.9 pg — ABNORMAL LOW (ref 26.0–34.0)
MCHC: 32.7 g/dL (ref 30.0–36.0)
MCV: 76.2 fL — ABNORMAL LOW (ref 78.0–100.0)
MONOS PCT: 9 % (ref 3–12)
Monocytes Absolute: 0.5 10*3/uL (ref 0.1–1.0)
NEUTROS PCT: 38 % — AB (ref 43–77)
Neutro Abs: 1.9 10*3/uL (ref 1.7–7.7)
PLATELETS: 197 10*3/uL (ref 150–400)
RBC: 4.29 MIL/uL (ref 3.87–5.11)
RDW: 12.7 % (ref 11.5–15.5)
WBC: 5 10*3/uL (ref 4.0–10.5)

## 2014-02-24 LAB — BASIC METABOLIC PANEL
BUN: 16 mg/dL (ref 6–23)
CALCIUM: 9.2 mg/dL (ref 8.4–10.5)
CO2: 27 mEq/L (ref 19–32)
CREATININE: 0.7 mg/dL (ref 0.50–1.10)
Chloride: 102 mEq/L (ref 96–112)
GFR calc non Af Amer: >90 mL/min (ref >90)
Glucose, Bld: 94 mg/dL (ref 70–99)
Potassium: 4.1 mEq/L (ref 3.7–5.3)
Sodium: 140 mEq/L (ref 137–147)

## 2014-02-24 MED ORDER — TRAMADOL HCL 50 MG PO TABS: 50.0000 mg | ORAL_TABLET | Freq: Four times a day (QID) | ORAL | Status: DC | PRN

## 2014-02-24 NOTE — Discharge Instructions (Signed)
Chest Pain (Nonspecific) °Chest pain has many causes. Your pain could be caused by something serious, such as a heart attack or a blood clot in the lungs. It could also be caused by something less serious, such as a chest bruise or a virus. Follow up with your doctor. More lab tests or other studies may be needed to find the cause of your pain. Most of the time, nonspecific chest pain will improve within 2 to 3 days of rest and mild pain medicine. °HOME CARE °· For chest bruises, you may put ice on the sore area for 15-20 minutes, 03-04 times a day. Do this only if it makes you feel better. °· Put ice in a plastic bag. °· Place a towel between the skin and the bag. °· Rest for the next 2 to 3 days. °· Go back to work if the pain improves. °· See your doctor if the pain lasts longer than 1 to 2 weeks. °· Only take medicine as told by your doctor. °· Quit smoking if you smoke. °GET HELP RIGHT AWAY IF:  °· There is more pain or pain that spreads to the arm, neck, jaw, back, or belly (abdomen). °· You have shortness of breath. °· You cough more than usual or cough up blood. °· You have very bad back or belly pain, feel sick to your stomach (nauseous), or throw up (vomit). °· You have very bad weakness. °· You pass out (faint). °· You have a fever. °Any of these problems may be serious and may be an emergency. Do not wait to see if the problems will go away. Get medical help right away. Call your local emergency services 911 in [place] Do not drive yourself to the hospital. °MAKE SURE YOU:  °· Understand these instructions. °· Will watch this condition. °· Will get help right away if you or your child is not doing well or gets worse. °Document Released: 03/28/2008 Document Revised: 01/02/2012 Document Reviewed: 03/28/2008 °ExitCare® Patient Information ©2014 [employer]. ° °

## 2014-02-24 NOTE — ED Notes (Signed)
Pt reports 1 week of substernal cp, sob and nausea.  Reports also feels anemic (nausea usually primary s/s).

## 2014-02-24 NOTE — ED Provider Notes (Signed)
CSN: [id number]     Arrival date & time 02/24/14  1453 History   First MD Initiated Contact with Patient 02/24/14 1503     Chief Complaint  Patient presents with  . Chest Pain     (Consider location/radiation/quality/duration/timing/severity/associated sxs/prior Treatment) HPI Comments: Pt c/o substernal pressure for the last week. Nothing seems to make the pain better or worse. Denies cough, fever. She is anemic and hasn't been taking her iron and nausea is usually her symptoms. States that she also has a history of reflux  The history is provided by the patient. No language interpreter was used.    Past Medical History  Diagnosis Date  . Anemia   . Chlamydia    Past Surgical History  Procedure Laterality Date  . Tibia fracture surgery     No family history on file. History  Substance Use Topics  . Smoking status: Never Smoker   . Smokeless tobacco: Never Used  . Alcohol Use: Yes     Comment: occ   OB History   Grav Para Term Preterm Abortions TAB SAB Ect Mult Living   2 1 1  0 0 0 0 0 0 1     Review of Systems  Respiratory: Positive for shortness of breath.   Cardiovascular: Positive for chest pain.      Allergies  Review of patient's allergies indicates no known allergies.  Home Medications   Prior to Admission medications   Medication Sig Start Date End Date Taking? Authorizing Provider  Prenatal Vit-Fe Fumarate-FA (PRENATAL MULTIVITAMIN) TABS tablet Take 1 tablet by mouth daily at 12 noon.    Historical Provider, MD   BP 114/73  Pulse 83  Temp(Src) 98.9 F (37.2 C) (Oral)  Resp 18  Ht 5\' 9"  (1.753 m)  Wt 176 lb (79.833 kg)  BMI 25.98 kg/m2  SpO2 100%  LMP 01/27/2014 Physical Exam  Nursing note reviewed. Constitutional: She is oriented to person, place, and time. She appears well-developed and well-nourished.  HENT:  Head: Normocephalic and atraumatic.  Cardiovascular: Normal rate and regular rhythm.   Pulmonary/Chest: Effort normal and breath  sounds normal. She exhibits no tenderness.  Abdominal: Soft. Bowel sounds are normal.  Musculoskeletal: Normal range of motion.  Neurological: She is alert and oriented to person, place, and time.  Skin: Skin is warm and dry.  Psychiatric: She has a normal mood and affect.    ED Course  Procedures (including critical care time) Labs Review Labs Reviewed  CBC WITH DIFFERENTIAL - Abnormal; Notable for the following:    Hemoglobin 10.7 (*)    HCT 32.7 (*)    MCV 76.2 (*)    MCH 24.9 (*)    Neutrophils Relative % 38 (*)    Lymphocytes Relative 51 (*)    All other components within normal limits  BASIC METABOLIC PANEL    Imaging Review Dg Chest 2 View  02/24/2014   CLINICAL DATA:  Substernal chest pain, shortness of breath, nausea  EXAM: CHEST  2 VIEW  COMPARISON:  None.  FINDINGS: The heart size and mediastinal contours are within normal limits. Both lungs are clear. The visualized skeletal structures are unremarkable.  IMPRESSION: No active cardiopulmonary disease.   Electronically Signed   By: Ruel Favorsrevor  Shick M.D.   On: 02/24/2014 15:46     EKG Interpretation   Date/Time:  Monday Feb 24 2014 14:59:26 EDT Ventricular Rate:  79 PR Interval:  154 QRS Duration: 98 QT Interval:  360 QTC Calculation: 412 R Axis:  48 Text Interpretation:  Normal sinus rhythm Normal ECG Confirmed by DELOS   MD, DOUGLAS ([id number]) on 02/24/2014 3:01:41 PM      MDM   Final diagnoses:  Chest pain    Mild anemia. No infection noted on x-ray. ecg normal:will treat with something for pain    Teressa LowerVrinda Nahzir Pohle, NP 02/24/14 1624

## 2014-02-25 NOTE — ED Provider Notes (Signed)
Medical screening examination/treatment/procedure(s) were performed by non-physician practitioner and as supervising physician I was immediately available for consultation/collaboration.   EKG Interpretation   Date/Time:  Monday Feb 24 2014 14:59:26 EDT Ventricular Rate:  79 PR Interval:  154 QRS Duration: 98 QT Interval:  360 QTC Calculation: 412 R Axis:   48 Text Interpretation:  Normal sinus rhythm Normal ECG Confirmed by Malva CoganELOS   MD, Cynithia Hakimi ([id number]) on 02/24/2014 3:01:41 PM       Geoffery Lyonsouglas Jayna Mulnix, MD 02/25/14 [id number]

## 2014-04-12 ENCOUNTER — Encounter (HOSPITAL_COMMUNITY): Payer: Self-pay | Admitting: *Deleted

## 2014-04-12 ENCOUNTER — Inpatient Hospital Stay (HOSPITAL_COMMUNITY)
Admission: AD | Admit: 2014-04-12 | Discharge: 2014-04-13 | Disposition: A | Discharge status: Home / Self Care | Payer: Medicaid Other | Source: Ambulatory Visit | Attending: Obstetrics and Gynecology | Admitting: Obstetrics and Gynecology

## 2014-04-12 DIAGNOSIS — A499 Bacterial infection, unspecified: Secondary | ICD-10-CM | POA: Insufficient documentation

## 2014-04-12 DIAGNOSIS — N760 Acute vaginitis: Secondary | ICD-10-CM | POA: Insufficient documentation

## 2014-04-12 DIAGNOSIS — B9689 Other specified bacterial agents as the cause of diseases classified elsewhere: Secondary | ICD-10-CM | POA: Insufficient documentation

## 2014-04-12 DIAGNOSIS — R109 Unspecified abdominal pain: Secondary | ICD-10-CM | POA: Insufficient documentation

## 2014-04-12 LAB — URINALYSIS, ROUTINE W REFLEX MICROSCOPIC
BILIRUBIN URINE: NEGATIVE
Glucose, UA: NEGATIVE mg/dL
Hgb urine dipstick: NEGATIVE
Ketones, ur: NEGATIVE mg/dL
LEUKOCYTES UA: NEGATIVE
NITRITE: NEGATIVE
PH: 7 (ref 5.0–8.0)
Protein, ur: NEGATIVE mg/dL
Specific Gravity, Urine: 1.015 (ref 1.005–1.030)
UROBILINOGEN UA: 0.2 mg/dL (ref 0.0–1.0)

## 2014-04-12 LAB — POCT PREGNANCY, URINE: Preg Test, Ur: NEGATIVE

## 2014-04-12 NOTE — MAU Provider Note (Signed)
History     CSN: [id number]  Arrival date and time: 04/12/14 2024   First Provider Initiated Contact with Patient 04/12/14 2331      Chief Complaint  Patient presents with  . Fatigue  . Vaginal Bleeding  . Abdominal Pain   Vaginal Bleeding Associated symptoms include abdominal pain and nausea. Pertinent negatives include no dysuria, frequency, urgency or vomiting.  Abdominal Pain Associated symptoms include nausea. Pertinent negatives include no dysuria, frequency or vomiting.    Pt is a 23 yo G2P1011 here with report of last normal cycle on 03/08/14.  Here due to concern for spotting that occurred on 04/07/14.  Bleeding lasted until 5/19 and varied between pink and brown, always light spotting.  Cycles typically occur every 35-36 days.  No current birth control method.  +sexually active.  No abnormal odor.  Intermittent sharp left pelvic pain that occurs few times a day and last for a few seconds (history of left ovarian cyst).    Past Medical History  Diagnosis Date  . Anemia   . Chlamydia     Past Surgical History  Procedure Laterality Date  . Tibia fracture surgery      Family History  Problem Relation Age of Onset  . Alcohol abuse Neg Hx     History  Substance Use Topics  . Smoking status: Never Smoker   . Smokeless tobacco: Never Used  . Alcohol Use: Yes     Comment: occ    Allergies: No Known Allergies  Prescriptions prior to admission  Medication Sig Dispense Refill  . Prenatal Vit-Fe Fumarate-FA (PRENATAL MULTIVITAMIN) TABS tablet Take 1 tablet by mouth daily at 12 noon.      . traMADol (ULTRAM) 50 MG tablet Take 1 tablet (50 mg total) by mouth every 6 (six) hours as needed.  15 tablet  0    Review of Systems  Constitutional: Positive for malaise/fatigue.  Gastrointestinal: Positive for nausea and abdominal pain. Negative for vomiting.  Genitourinary: Positive for vaginal bleeding. Negative for dysuria, urgency and frequency.       Vaginal spotting  of blood  All other systems reviewed and are negative.  Physical Exam   Blood pressure 123/78, pulse 85, temperature 98.1 F (36.7 C), resp. rate 18, height 5' 8.5" (1.74 m), weight 83.28 kg (183 lb 9.6 oz), last menstrual period 03/08/2014, unknown if currently breastfeeding.  Physical Exam  Constitutional: She is oriented to person, place, and time. She appears well-developed and well-nourished.  HENT:  Head: Normocephalic.  Eyes: Pupils are equal, round, and reactive to light.  Neck: Normal range of motion. Neck supple.  Cardiovascular: Normal rate, regular rhythm and normal heart sounds.   Respiratory: Effort normal and breath sounds normal.  GI: Soft. She exhibits no mass. There is tenderness. There is no guarding.  Genitourinary: Cervix exhibits friability. No bleeding around the vagina. Vaginal discharge (white, creamy) found.  Negative cervical motion tenderness; scant cervical friability  Neurological: She is alert and oriented to person, place, and time. She has normal reflexes.  Skin: Skin is warm and dry.    MAU Course  Procedures  Results for orders placed during the hospital encounter of 04/12/14 (from the past 24 hour(s))  URINALYSIS, ROUTINE W REFLEX MICROSCOPIC     Status: None   Collection Time    04/12/14  8:40 PM      Result Value Ref Range   Color, Urine YELLOW  YELLOW   APPearance CLEAR  CLEAR   Specific Gravity, Urine  1.015  1.005 - 1.030   pH 7.0  5.0 - 8.0   Glucose, UA NEGATIVE  NEGATIVE mg/dL   Hgb urine dipstick NEGATIVE  NEGATIVE   Bilirubin Urine NEGATIVE  NEGATIVE   Ketones, ur NEGATIVE  NEGATIVE mg/dL   Protein, ur NEGATIVE  NEGATIVE mg/dL   Urobilinogen, UA 0.2  0.0 - 1.0 mg/dL   Nitrite NEGATIVE  NEGATIVE   Leukocytes, UA NEGATIVE  NEGATIVE  POCT PREGNANCY, URINE     Status: None   Collection Time    04/12/14  8:55 PM      Result Value Ref Range   Preg Test, Ur NEGATIVE  NEGATIVE  WET PREP, GENITAL     Status: Abnormal   Collection  Time    04/12/14 11:45 PM      Result Value Ref Range   Yeast Wet Prep HPF POC NONE SEEN  NONE SEEN   Trich, Wet Prep NONE SEEN  NONE SEEN   Clue Cells Wet Prep HPF POC MODERATE (*) NONE SEEN   WBC, Wet Prep HPF POC FEW (*) NONE SEEN     Assessment and Plan  Bacterial Vaginosis  Plan: Discharge to home RX Flagyl 500 mg BID x 7 days GC/CT pending   [facility],Sigrid Schwebach 04/12/2014, 11:33 PM

## 2014-04-12 NOTE — MAU Note (Signed)
Spotting since the 15th and continues. Neg UPT today. LMP 5-16. Having some abd pain and just dragging

## 2014-04-13 DIAGNOSIS — N760 Acute vaginitis: Secondary | ICD-10-CM

## 2014-04-13 DIAGNOSIS — A499 Bacterial infection, unspecified: Secondary | ICD-10-CM

## 2014-04-13 DIAGNOSIS — B9689 Other specified bacterial agents as the cause of diseases classified elsewhere: Secondary | ICD-10-CM

## 2014-04-13 LAB — WET PREP, GENITAL
Trich, Wet Prep: NONE SEEN
Yeast Wet Prep HPF POC: NONE SEEN

## 2014-04-13 MED ORDER — METRONIDAZOLE 500 MG PO TABS: 500.0000 mg | ORAL_TABLET | Freq: Two times a day (BID) | ORAL | Status: DC

## 2014-04-13 NOTE — Discharge Instructions (Signed)
Bacterial Vaginosis Bacterial vaginosis is a vaginal infection that occurs when the normal balance of bacteria in the vagina is disrupted. It results from an overgrowth of certain bacteria. This is the most common vaginal infection in women of childbearing age. Treatment is important to prevent complications, especially in pregnant women, as it can cause a premature delivery. CAUSES  Bacterial vaginosis is caused by an increase in harmful bacteria that are normally present in smaller amounts in the vagina. Several different kinds of bacteria can cause bacterial vaginosis. However, the reason that the condition develops is not fully understood. RISK FACTORS Certain activities or behaviors can put you at an increased risk of developing bacterial vaginosis, including:  Having a new sex partner or multiple sex partners.  Douching.  Using an intrauterine device (IUD) for contraception. Women do not get bacterial vaginosis from toilet seats, bedding, swimming pools, or contact with objects around them. SIGNS AND SYMPTOMS  Some women with bacterial vaginosis have no signs or symptoms. Common symptoms include:  Grey vaginal discharge.  A fishlike odor with discharge, especially after sexual intercourse.  Itching or burning of the vagina and vulva.  Burning or pain with urination. DIAGNOSIS  Your health care provider will take a medical history and examine the vagina for signs of bacterial vaginosis. A sample of vaginal fluid may be taken. Your health care provider will look at this sample under a microscope to check for bacteria and abnormal cells. A vaginal pH test may also be done.  TREATMENT  Bacterial vaginosis may be treated with antibiotic medicines. These may be given in the form of a pill or a vaginal cream. A second round of antibiotics may be prescribed if the condition comes back after treatment.  HOME CARE INSTRUCTIONS   Only take over-the-counter or prescription medicines as  directed by your health care provider.  If antibiotic medicine was prescribed, take it as directed. Make sure you finish it even if you start to feel better.  Do not have sex until treatment is completed.  Tell all sexual partners that you have a vaginal infection. They should see their health care provider and be treated if they have problems, such as a mild rash or itching.  Practice safe sex by using condoms and only having one sex partner. SEEK MEDICAL CARE IF:   Your symptoms are not improving after 3 days of treatment.  You have increased discharge or pain.  You have a fever. MAKE SURE YOU:   Understand these instructions.  Will watch your condition.  Will get help right away if you are not doing well or get worse. FOR MORE INFORMATION  Centers for Disease Control and Prevention, Division of STD Prevention: www.cdc.gov/std American Sexual Health Association (ASHA): www.ashastd.org  Document Released: 10/10/2005 Document Revised: 07/31/2013 Document Reviewed: 05/22/2013 ExitCare Patient Information 2015 [employer]. This information is not intended to replace advice given to you by your health care provider. Make sure you discuss any questions you have with your health care provider.  

## 2014-04-13 NOTE — Progress Notes (Signed)
Written and verbal d/c instructions given and understanding voiced. 

## 2014-04-13 NOTE — MAU Provider Note (Signed)
Attestation of Attending Supervision of Advanced Practitioner (CNM/NP): Evaluation and management procedures were performed by the Advanced Practitioner under my supervision and collaboration.  I have reviewed the Advanced Practitioner's note and chart, and I agree with the management and plan.  CONSTANT,PEGGY 04/13/2014 6:42 AM   

## 2014-04-14 LAB — GC/CHLAMYDIA PROBE AMP
CT Probe RNA: NEGATIVE
GC PROBE AMP APTIMA: NEGATIVE

## 2014-08-25 ENCOUNTER — Encounter (HOSPITAL_COMMUNITY): Payer: Self-pay | Admitting: *Deleted

## 2015-03-11 ENCOUNTER — Emergency Department (HOSPITAL_BASED_OUTPATIENT_CLINIC_OR_DEPARTMENT_OTHER)
Admission: EM | Admit: 2015-03-11 | Discharge: 2015-03-11 | Disposition: A | Discharge status: Home / Self Care | Payer: Medicaid Other | Attending: Emergency Medicine | Admitting: Emergency Medicine

## 2015-03-11 ENCOUNTER — Encounter (HOSPITAL_BASED_OUTPATIENT_CLINIC_OR_DEPARTMENT_OTHER): Payer: Self-pay | Admitting: Emergency Medicine

## 2015-03-11 DIAGNOSIS — D649 Anemia, unspecified: Secondary | ICD-10-CM | POA: Diagnosis not present

## 2015-03-11 DIAGNOSIS — Z79899 Other long term (current) drug therapy: Secondary | ICD-10-CM | POA: Diagnosis not present

## 2015-03-11 DIAGNOSIS — N760 Acute vaginitis: Secondary | ICD-10-CM | POA: Diagnosis not present

## 2015-03-11 DIAGNOSIS — Z8619 Personal history of other infectious and parasitic diseases: Secondary | ICD-10-CM | POA: Diagnosis not present

## 2015-03-11 DIAGNOSIS — N939 Abnormal uterine and vaginal bleeding, unspecified: Secondary | ICD-10-CM | POA: Diagnosis present

## 2015-03-11 DIAGNOSIS — Z3202 Encounter for pregnancy test, result negative: Secondary | ICD-10-CM | POA: Insufficient documentation

## 2015-03-11 DIAGNOSIS — B9689 Other specified bacterial agents as the cause of diseases classified elsewhere: Secondary | ICD-10-CM

## 2015-03-11 LAB — WET PREP, GENITAL
Trich, Wet Prep: NONE SEEN
YEAST WET PREP: NONE SEEN

## 2015-03-11 LAB — URINALYSIS, ROUTINE W REFLEX MICROSCOPIC
Bilirubin Urine: NEGATIVE
Glucose, UA: NEGATIVE mg/dL
Ketones, ur: NEGATIVE mg/dL
Leukocytes, UA: NEGATIVE
Nitrite: NEGATIVE
Protein, ur: NEGATIVE mg/dL
Specific Gravity, Urine: 1.02 (ref 1.005–1.030)
Urobilinogen, UA: 0.2 mg/dL (ref 0.0–1.0)
pH: 7 (ref 5.0–8.0)

## 2015-03-11 LAB — URINE MICROSCOPIC-ADD ON

## 2015-03-11 LAB — PREGNANCY, URINE: Preg Test, Ur: NEGATIVE

## 2015-03-11 MED ORDER — METRONIDAZOLE 500 MG PO TABS
500.0000 mg | ORAL_TABLET | Freq: Once | ORAL | Status: AC
  Administered 2015-03-11: 500 mg via ORAL
  Filled 2015-03-11: qty 1

## 2015-03-11 MED ORDER — METRONIDAZOLE 500 MG PO TABS: 500.0000 mg | ORAL_TABLET | Freq: Two times a day (BID) | ORAL | Status: DC

## 2015-03-11 NOTE — Discharge Instructions (Signed)
Take flagyl as prescribed.   Follow up with your doctor.   Return to ER if you have worse discharge, more bleeding, severe pain.

## 2015-03-11 NOTE — ED Provider Notes (Signed)
CSN: [id number]     Arrival date & time 03/11/15  1858 History  This chart was scribed for Richardean Canalavid H Livia Tarr, MD by Jarvis Morganaylor Ferguson, ED Scribe. This patient was seen in room MH11/MH11 and the patient's care was started at 7:06 PM.    Chief Complaint  Patient presents with  . Vaginal Bleeding    The history is provided by the patient. No language interpreter was used.    HPI Comments: Desiree Coombesicole P Neubert is a 24 y.o. female with h/o anemia who presents to the Emergency Department complaining of intermittent, mild, vaginal bleeding for 10 days. She is having associated nausea. Pt states this is different from her typical menstrual cycles. She notes her LNMP was 02/11/15. She reports that one of the days the bleeding was bright red and heavy but then tapered off to "spotting". Pt states she has just been having to use panty liners intermittently.  Pt is currently sexually active. She notes a h/o anemia and states her normal blood count is around 9. She denies any emesis, fever, or vaginal discharge   Past Medical History  Diagnosis Date  . Anemia   . Chlamydia    Past Surgical History  Procedure Laterality Date  . Tibia fracture surgery     Family History  Problem Relation Age of Onset  . Alcohol abuse Neg Hx    History  Substance Use Topics  . Smoking status: Never Smoker   . Smokeless tobacco: Never Used  . Alcohol Use: Yes     Comment: occ   OB History    Gravida Para Term Preterm AB TAB SAB Ectopic Multiple Living   2 1 1  0 1 0 1 0 0 1     Review of Systems  Constitutional: Negative for fever.  Gastrointestinal: Positive for nausea. Negative for vomiting.  Genitourinary: Positive for vaginal bleeding. Negative for vaginal discharge.  All other systems reviewed and are negative.     Allergies  Review of patient's allergies indicates no known allergies.  Home Medications   Prior to Admission medications   Medication Sig Start Date End Date Taking? Authorizing Provider   metroNIDAZOLE (FLAGYL) 500 MG tablet Take 1 tablet (500 mg total) by mouth 2 (two) times daily. 04/13/14   Marlis EdelsonWalidah N Karim, CNM  Prenatal Vit-Fe Fumarate-FA (PRENATAL MULTIVITAMIN) TABS tablet Take 1 tablet by mouth daily at 12 noon.    Historical Provider, MD  traMADol (ULTRAM) 50 MG tablet Take 1 tablet (50 mg total) by mouth every 6 (six) hours as needed. 02/24/14   Teressa LowerVrinda Pickering, NP   Triage Vitals: BP 118/72 mmHg  Pulse 82  Temp(Src) 98.4 F (36.9 C) (Oral)  Resp 18  Ht 5\' 9"  (1.753 m)  Wt 185 lb (83.915 kg)  BMI 27.31 kg/m2  SpO2 97%  LMP 02/07/2015   Physical Exam  Constitutional: She is oriented to person, place, and time. She appears well-developed and well-nourished. No distress.  HENT:  Head: Normocephalic and atraumatic.  Mouth/Throat: Mucous membranes are normal.  Eyes: Conjunctivae and EOM are normal.  Neck: Neck supple. No tracheal deviation present.  Cardiovascular: Normal rate, regular rhythm and normal heart sounds.   Pulmonary/Chest: Effort normal and breath sounds normal. No respiratory distress. She has no wheezes. She has no rales.  Abdominal: Soft. There is no tenderness. There is no CVA tenderness.  Genitourinary:  Small amount of blood at os, brownish discharge   Musculoskeletal: Normal range of motion.  Neurological: She is alert and oriented to  person, place, and time.  Skin: Skin is warm and dry.  Psychiatric: She has a normal mood and affect. Her behavior is normal.  Nursing note and vitals reviewed.   ED Course  Procedures (including critical care time)  DIAGNOSTIC STUDIES: Oxygen Saturation is 97% on RA, normal by my interpretation.    COORDINATION OF CARE: 7:13 PM- Will order UA, u-preg and wet prep. Pt advised of plan for treatment and pt agrees.   Labs Review Labs Reviewed  WET PREP, GENITAL - Abnormal; Notable for the following:    Clue Cells Wet Prep HPF POC FEW (*)    WBC, Wet Prep HPF POC MODERATE (*)    All other components  within normal limits  URINALYSIS, ROUTINE W REFLEX MICROSCOPIC - Abnormal; Notable for the following:    Hgb urine dipstick SMALL (*)    All other components within normal limits  PREGNANCY, URINE  URINE MICROSCOPIC-ADD ON  GC/CHLAMYDIA PROBE AMP ([place])    Imaging Review No results found.   EKG Interpretation None      MDM   Final diagnoses:  None   Desiree Rice is a 24 y.o. female here with vaginal spotting, brownish discharge. UCG neg. + mod WBC and clue cells. Likely BV. Will dc home with flagyl.    I personally performed the services described in this documentation, which was scribed in my presence. The recorded information has been reviewed and is accurate.    Richardean Canalavid H Vana Arif, MD 03/11/15 [phone]

## 2015-03-11 NOTE — ED Notes (Signed)
Pt states she has been spotting for about a week and a half.

## 2015-03-12 LAB — GC/CHLAMYDIA PROBE AMP (~~LOC~~) NOT AT ARMC
CHLAMYDIA, DNA PROBE: NEGATIVE
NEISSERIA GONORRHEA: NEGATIVE

## 2015-04-06 ENCOUNTER — Encounter (HOSPITAL_BASED_OUTPATIENT_CLINIC_OR_DEPARTMENT_OTHER): Payer: Self-pay | Admitting: Emergency Medicine

## 2015-04-06 ENCOUNTER — Emergency Department (HOSPITAL_BASED_OUTPATIENT_CLINIC_OR_DEPARTMENT_OTHER)
Admission: EM | Admit: 2015-04-06 | Discharge: 2015-04-06 | Disposition: A | Discharge status: Home / Self Care | Payer: Medicaid Other | Attending: Emergency Medicine | Admitting: Emergency Medicine

## 2015-04-06 DIAGNOSIS — S8392XA Sprain of unspecified site of left knee, initial encounter: Secondary | ICD-10-CM | POA: Insufficient documentation

## 2015-04-06 DIAGNOSIS — Z792 Long term (current) use of antibiotics: Secondary | ICD-10-CM | POA: Insufficient documentation

## 2015-04-06 DIAGNOSIS — W1839XA Other fall on same level, initial encounter: Secondary | ICD-10-CM | POA: Diagnosis not present

## 2015-04-06 DIAGNOSIS — Y998 Other external cause status: Secondary | ICD-10-CM | POA: Diagnosis not present

## 2015-04-06 DIAGNOSIS — S8992XA Unspecified injury of left lower leg, initial encounter: Secondary | ICD-10-CM | POA: Diagnosis present

## 2015-04-06 DIAGNOSIS — D649 Anemia, unspecified: Secondary | ICD-10-CM | POA: Insufficient documentation

## 2015-04-06 DIAGNOSIS — Y9289 Other specified places as the place of occurrence of the external cause: Secondary | ICD-10-CM | POA: Insufficient documentation

## 2015-04-06 DIAGNOSIS — Z8619 Personal history of other infectious and parasitic diseases: Secondary | ICD-10-CM | POA: Insufficient documentation

## 2015-04-06 DIAGNOSIS — Y9389 Activity, other specified: Secondary | ICD-10-CM | POA: Insufficient documentation

## 2015-04-06 DIAGNOSIS — Z79899 Other long term (current) drug therapy: Secondary | ICD-10-CM | POA: Insufficient documentation

## 2015-04-06 NOTE — Discharge Instructions (Signed)
Ibuprofen 600 mg 3 times daily for the next 5 days.  Wear Ace bandage as applied.  Follow-up with orthopedics if not improving in the next week. The contact information for Dr. Eulah Pont has been provided in this discharge summary. Call to arrange this appointment.   Joint Sprain A sprain is a tear or stretch in the ligaments that hold a joint together. Severe sprains may need as long as 3-6 weeks of immobilization and/or exercises to heal completely. Sprained joints should be rested and protected. If not, they can become unstable and prone to re-injury. Proper treatment can reduce your pain, shorten the period of disability, and reduce the risk of repeated injuries. TREATMENT   Rest and elevate the injured joint to reduce pain and swelling.  Apply ice packs to the injury for 20-30 minutes every 2-3 hours for the next 2-3 days.  Keep the injury wrapped in a compression bandage or splint as long as the joint is painful or as instructed by your caregiver.  Do not use the injured joint until it is completely healed to prevent re-injury and chronic instability. Follow the instructions of your caregiver.  Long-term sprain management may require exercises and/or treatment by a physical therapist. Taping or special braces may help stabilize the joint until it is completely better. SEEK MEDICAL CARE IF:   You develop increased pain or swelling of the joint.  You develop increasing redness and warmth of the joint.  You develop a fever.  It becomes stiff.  Your hand or foot gets cold or numb. Document Released: 11/17/2004 Document Revised: 01/02/2012 Document Reviewed: 10/27/2008 [facility] Patient Information 2015 [place], [place]. This information is not intended to replace advice given to you by your health care provider. Make sure you discuss any questions you have with your health care provider.

## 2015-04-06 NOTE — ED Notes (Signed)
Patient reports that she fell about 2 weeks ago and has "numbeness" to her left knee. The patient reports that it is radiating down her leg

## 2015-04-06 NOTE — ED Notes (Signed)
MD at bedside. 

## 2015-04-06 NOTE — ED Notes (Signed)
Patient preparing for discharge. 

## 2015-04-06 NOTE — ED Provider Notes (Signed)
CSN: [id number]     Arrival date & time 04/06/15  1506 History  This chart was scribed for Geoffery Lyons, MD by Lyndel Safe, ED Scribe. This patient was seen in room MH09/MH09 and the patient's care was started 3:22 PM.   Chief Complaint  Patient presents with  . Knee Injury   The history is provided by the patient. No language interpreter was used.   HPI Comments: Desiree Rice is a 24 y.o. female, with a PMhx of anemia and chlamydia, who presents to the Emergency Department complaining of constant, moderate left knee numbness and tingling that radiates down her left leg to her foot s/p fall that occurred 2 weeks ago. She reports she twisted her left knee while going upstairs carrying her 26 year old and grocceries. When she stands on it at work she notes a numbness that radiates down her left leg. Pt describes the numbness as 'what it feels like when your leg is falling asleep'. The bilateral sides and back of her knee are numb but not painful. She reports she broke her left knee when she was 24 years old. She works in a [employer] and does a lot of walking and standing during work. She notes she has tendonitis in her left foot. Denies pain to her knee, fever, or difficulty ambulating.    Past Medical History  Diagnosis Date  . Anemia   . Chlamydia    Past Surgical History  Procedure Laterality Date  . Tibia fracture surgery     Family History  Problem Relation Age of Onset  . Alcohol abuse Neg Hx    History  Substance Use Topics  . Smoking status: Never Smoker   . Smokeless tobacco: Never Used  . Alcohol Use: Yes     Comment: occ   OB History    Gravida Para Term Preterm AB TAB SAB Ectopic Multiple Living   2 1 1  0 1 0 1 0 0 1     Review of Systems  Constitutional: Negative for fever.  Musculoskeletal: Negative for arthralgias and gait problem.  Neurological: Positive for numbness ( left knee).   A complete 10 system review of systems was obtained and is otherwise  negative except at noted in the HPI and PMH.  Allergies  Review of patient's allergies indicates no known allergies.  Home Medications   Prior to Admission medications   Medication Sig Start Date End Date Taking? Authorizing Provider  metroNIDAZOLE (FLAGYL) 500 MG tablet Take 1 tablet (500 mg total) by mouth 2 (two) times daily. 03/11/15   Richardean Canal, MD  Prenatal Vit-Fe Fumarate-FA (PRENATAL MULTIVITAMIN) TABS tablet Take 1 tablet by mouth daily at 12 noon.    Historical Provider, MD  traMADol (ULTRAM) 50 MG tablet Take 1 tablet (50 mg total) by mouth every 6 (six) hours as needed. 02/24/14   Teressa Lower, NP   BP 102/68 mmHg  Pulse 92  Temp(Src) 98.3 F (36.8 C) (Oral)  Resp 18  Ht 5\' 9"  (1.753 m)  Wt 180 lb (81.647 kg)  BMI 26.57 kg/m2  SpO2 95%  LMP 03/11/2015  Physical Exam  Constitutional: She is oriented to person, place, and time. She appears well-developed and well-nourished. No distress.  HENT:  Head: Normocephalic.  Right Ear: External ear normal.  Left Ear: External ear normal.  Mouth/Throat: No oropharyngeal exudate.  Eyes: Pupils are equal, round, and reactive to light. Right eye exhibits no discharge. Left eye exhibits no discharge. No scleral icterus.  Neck: Normal range of motion. Neck supple. No JVD present.  Cardiovascular: Normal rate, regular rhythm and normal heart sounds.   Pulmonary/Chest: Effort normal and breath sounds normal. No respiratory distress.  Musculoskeletal: She exhibits no edema or tenderness.  The left knee appears grossly normal. There is no effusion or crepitus with ROM. Stable AP and laterally. Distal PMS intact.  Lymphadenopathy:    She has no cervical adenopathy.  Neurological: She is alert and oriented to person, place, and time.  Skin: Skin is warm. No rash noted. No erythema. No pallor.  Psychiatric: She has a normal mood and affect. Her behavior is normal.  Nursing note and vitals reviewed.   ED Course  Procedures   DIAGNOSTIC STUDIES: Oxygen Saturation is 95% on RA, adequate by my interpretation.    COORDINATION OF CARE: 3:26 PM Discussed treatment plan which includes to apply an ace wrap with pt. Advised pt to take ibuprofen for tendonitis in left foot. Pt acknowledges and agrees to plan.   Labs Review Labs Reviewed - No data to display  Imaging Review No results found.   EKG Interpretation None      MDM   Final diagnoses:  None    Patient presents with complaints of numbness to her legs following a knee injury approximately 2 weeks ago. Her knee appears grossly normal with no effusion or instability. There is no redness, warmth, or swelling. Sensation, motor, and pulses are intact to the ankle and foot. I see no indication for x-rays or further workup. She is to give this an additional week. If it does not improve she is to follow-up with her primary Dr. for referral to an orthopedist.  I personally performed the services described in this documentation, which was scribed in my presence. The recorded information has been reviewed and is accurate.      Geoffery Lyons, MD 04/07/15 1323

## 2015-05-13 IMAGING — CR DG CHEST 2V
2 series · 2 of 2 positions shown · non-contrast
Comparison: None.

CLINICAL DATA: Substernal chest pain, shortness of breath, nausea

EXAM:
CHEST  2 VIEW

[w chest pa]
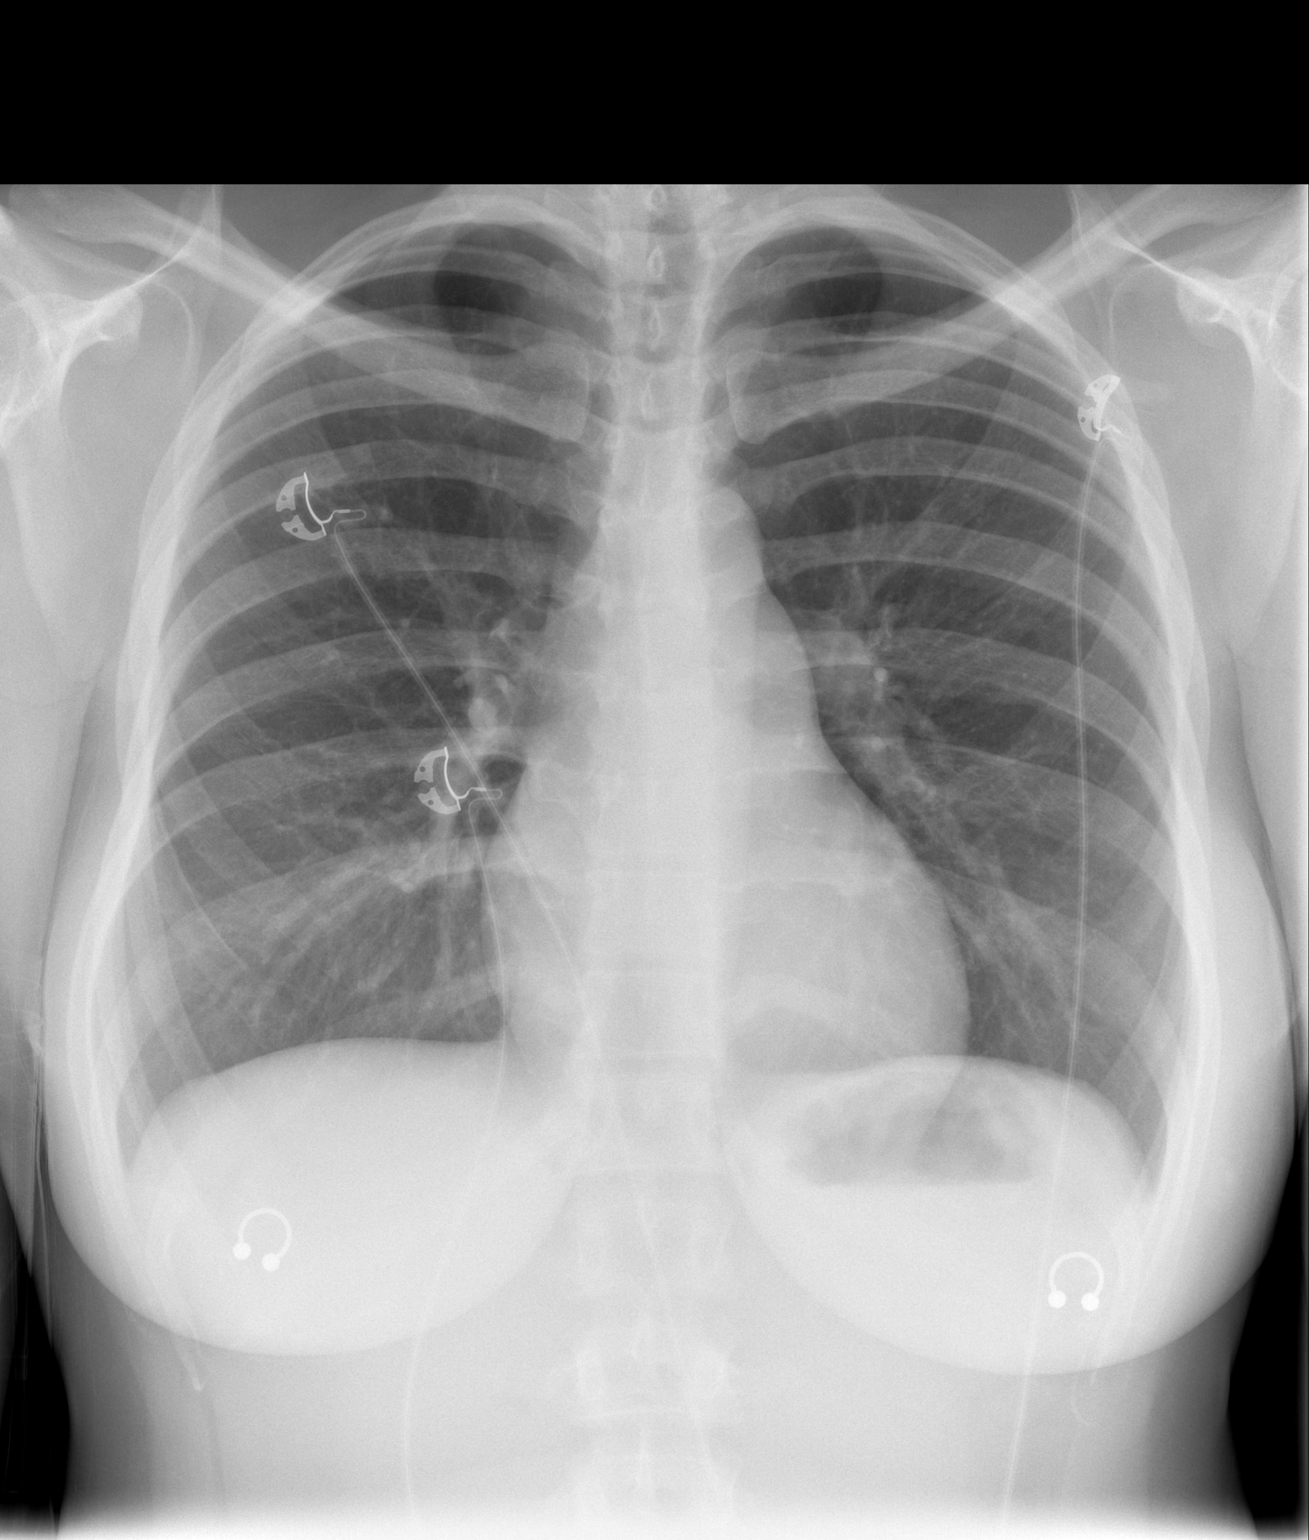

[w chest lat]
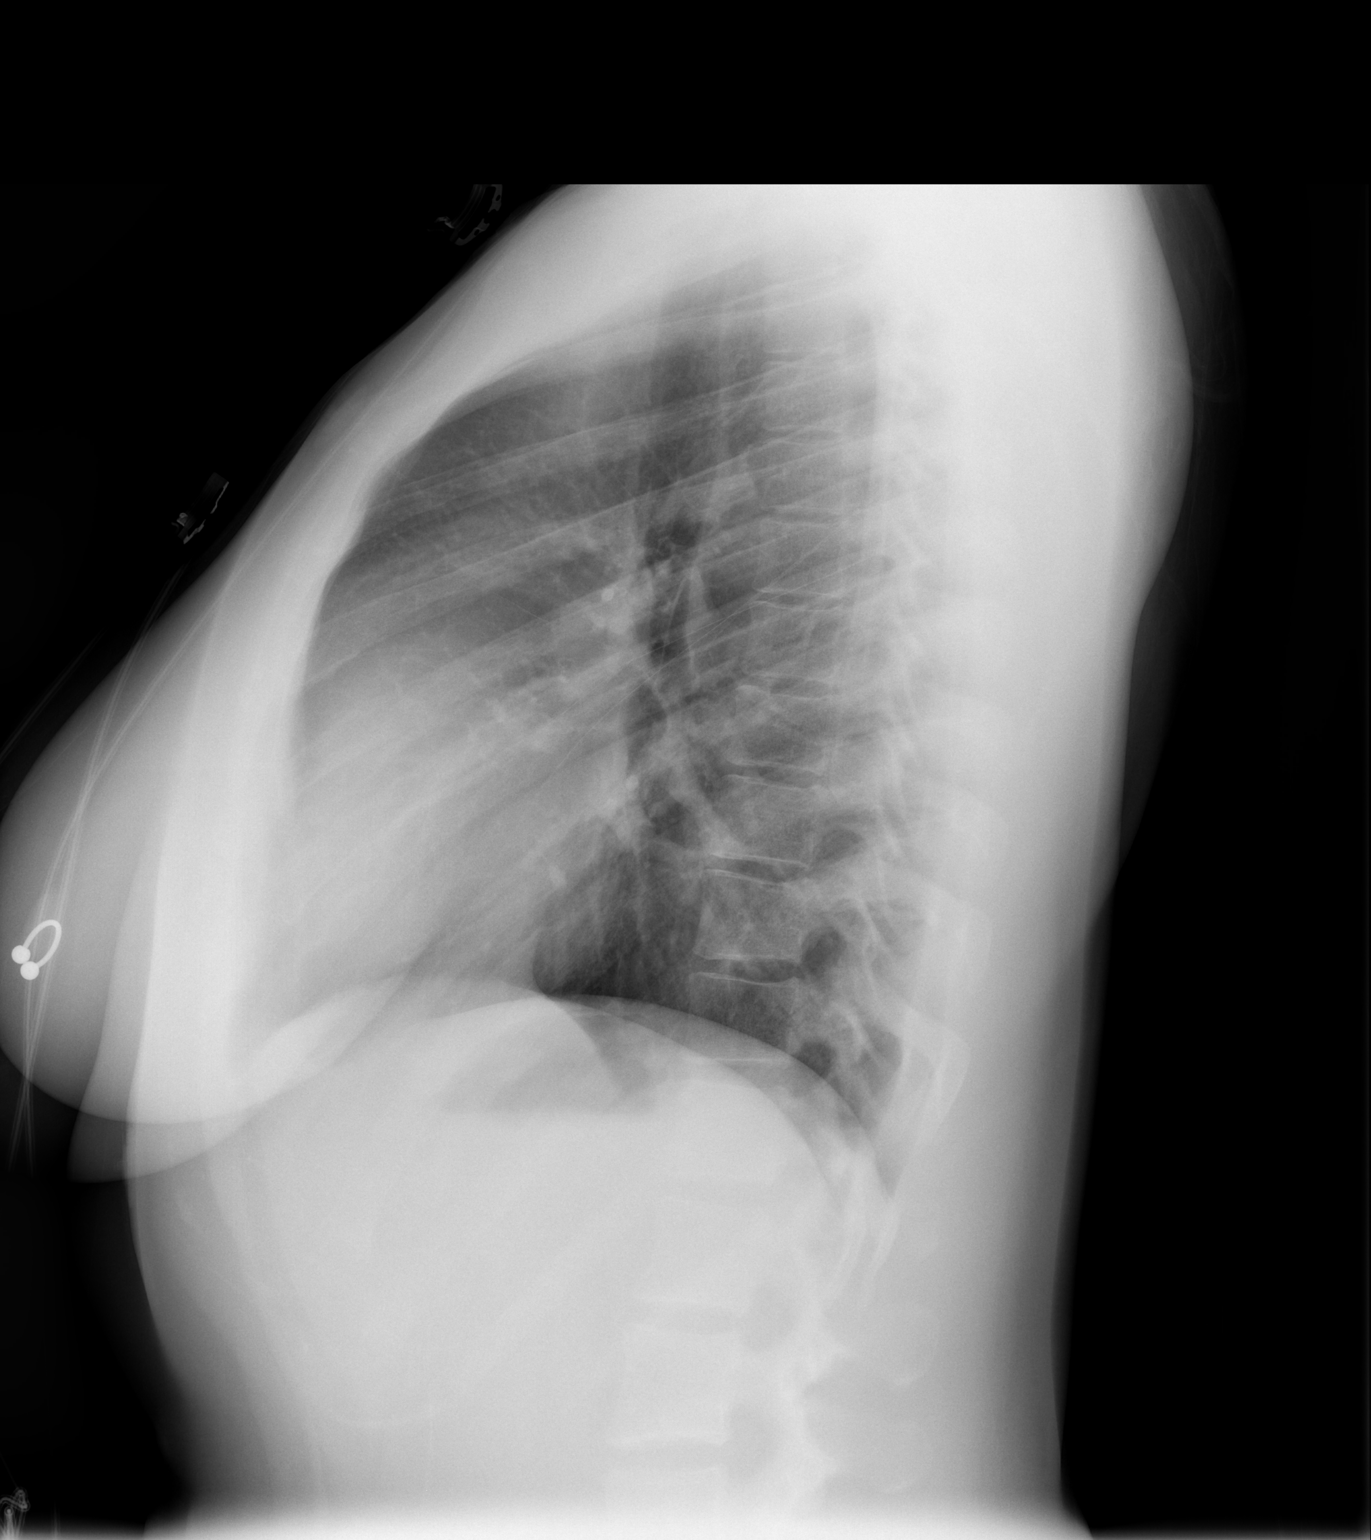

[2 of 2 positions shown; findings below may reference images not displayed]

FINDINGS: The heart size and mediastinal contours are within normal limits.
Both lungs are clear. The visualized skeletal structures are
unremarkable.
IMPRESSION: No active cardiopulmonary disease.

## 2017-06-22 ENCOUNTER — Emergency Department (HOSPITAL_BASED_OUTPATIENT_CLINIC_OR_DEPARTMENT_OTHER): Payer: Self-pay

## 2017-06-22 ENCOUNTER — Emergency Department (HOSPITAL_BASED_OUTPATIENT_CLINIC_OR_DEPARTMENT_OTHER)
Admission: EM | Admit: 2017-06-22 | Discharge: 2017-06-22 | Disposition: A | Discharge status: Home / Self Care | Payer: Self-pay | Attending: Emergency Medicine | Admitting: Emergency Medicine

## 2017-06-22 ENCOUNTER — Encounter (HOSPITAL_BASED_OUTPATIENT_CLINIC_OR_DEPARTMENT_OTHER): Payer: Self-pay | Admitting: *Deleted

## 2017-06-22 DIAGNOSIS — Z79899 Other long term (current) drug therapy: Secondary | ICD-10-CM | POA: Insufficient documentation

## 2017-06-22 DIAGNOSIS — M25561 Pain in right knee: Secondary | ICD-10-CM

## 2017-06-22 DIAGNOSIS — M25562 Pain in left knee: Secondary | ICD-10-CM | POA: Insufficient documentation

## 2017-06-22 MED ORDER — IBUPROFEN 800 MG PO TABS: 800.0000 mg | ORAL_TABLET | Freq: Three times a day (TID) | ORAL | 0 refills | Status: DC | PRN

## 2017-06-22 NOTE — ED Triage Notes (Signed)
Pt c/o left knee pain x 1 week , denies injury

## 2017-06-22 NOTE — Discharge Instructions (Signed)
Return here as needed. Follow up with the orthopedist. Ice and elevate °

## 2017-06-22 NOTE — ED Provider Notes (Signed)
[facility]   CSN: [id number] Arrival date & time: 06/22/17  1417     History   Chief Complaint Chief Complaint  Patient presents with  . Knee Pain    left    HPI Desiree Rice is a 26 y.o. female.  HPI Patient presents to the emergency department with left knee pain.  It has been ongoing for a week.  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  She states she does not have any known injury.  Patient states that movement and ambulation make the pain worse.  She states she did not take any medications prior to arrival.  Patient denies numbness, weakness, back pain, incontinence or syncope.  Patient states that she has never had injury to this knee Past Medical History:  Diagnosis Date  . Anemia   . Chlamydia     There are no active problems to display for this patient.   Past Surgical History:  Procedure Laterality Date  . TIBIA FRACTURE SURGERY      OB History    Gravida Para Term Preterm AB Living   2 1 1  0 1 1   SAB TAB Ectopic Multiple Live Births   1 0 0 0 1       Home Medications    Prior to Admission medications   Medication Sig Start Date End Date Taking? Authorizing Provider  Prenatal Vit-Fe Fumarate-FA (PRENATAL MULTIVITAMIN) TABS tablet Take 1 tablet by mouth daily at 12 noon.    [provider]    Family History Family History  Problem Relation Age of Onset  . Alcohol abuse Neg Hx     Social History Social History  Substance Use Topics  . Smoking status: Never Smoker  . Smokeless tobacco: Never Used  . Alcohol use Yes     Comment: occ     Allergies   Patient has no known allergies.   Review of Systems Review of Systems  All other systems negative except as documented in the HPI. All pertinent positives and negatives as reviewed in the HPI. Physical Exam Updated Vital Signs BP 122/80   Pulse 96   Temp 98 F (36.7 C) (Oral)   Resp 16   Ht 5\' 9"  (1.753 m)   Wt 88.9 kg (196 lb)   LMP  05/27/2017   SpO2 99%   BMI 28.94 kg/m   Physical Exam  Constitutional: She is oriented to person, place, and time. She appears well-developed and well-nourished. No distress.  HENT:  Head: Normocephalic and atraumatic.  Eyes: Pupils are equal, round, and reactive to light.  Pulmonary/Chest: Effort normal.  Musculoskeletal:       Left knee: She exhibits normal range of motion, no swelling, no effusion, no deformity, no erythema and normal alignment. Tenderness found. Medial joint line tenderness noted.  Neurological: She is alert and oriented to person, place, and time.  Skin: Skin is warm and dry.  Psychiatric: She has a normal mood and affect.  Nursing note and vitals reviewed.    ED Treatments / Results  Labs (all labs ordered are listed, but only abnormal results are displayed) Labs Reviewed - No data to display  EKG  EKG Interpretation None       Radiology Dg Knee Complete 4 Views Left  Result Date: 06/22/2017 CLINICAL DATA:  Knee pain.  No injury . EXAM: LEFT KNEE - COMPLETE 4+ VIEW COMPARISON:  No recent prior. FINDINGS: No acute bony or joint abnormality  identified. No evidence of fracture or dislocation. IMPRESSION: No acute abnormality. Electronically Signed   By: Maisie Fushomas  Register   On: 06/22/2017 17:08    Procedures Procedures (including critical care time)  Medications Ordered in ED Medications - No data to display   Initial Impression / Assessment and Plan / ED Course  I have reviewed the triage vital signs and the nursing notes.  Pertinent labs & imaging results that were available during my care of the patient were reviewed by me and considered in my medical decision making (see chart for details).     Patient most likely has irritation of the knee joint from her walking at work standing for long periods of time and we will place patient in a knee immobilizer and the patient follow with orthopedics.  Told to return here as needed.  Patient agrees the  plan and all questions were answered Final Clinical Impressions(s) / ED Diagnoses   Final diagnoses:  None    New Prescriptions New Prescriptions   No medications on file     Kyra MangesLawyer, Panzy Bubeck, PA-C 06/22/17 2259    Mesner, Barbara CowerJason, MD 06/22/17 2300

## 2017-06-22 NOTE — ED Notes (Signed)
ED Provider at bedside. 

## 2018-12-09 ENCOUNTER — Encounter (HOSPITAL_COMMUNITY): Payer: Self-pay

## 2018-12-09 ENCOUNTER — Inpatient Hospital Stay (HOSPITAL_COMMUNITY)
Admission: AD | Admit: 2018-12-09 | Discharge: 2018-12-09 | Disposition: A | Discharge status: Home / Self Care | Payer: BLUE CROSS/BLUE SHIELD | Source: Ambulatory Visit | Attending: Obstetrics & Gynecology | Admitting: Obstetrics & Gynecology

## 2018-12-09 DIAGNOSIS — R51 Headache: Secondary | ICD-10-CM | POA: Diagnosis present

## 2018-12-09 DIAGNOSIS — Z3A11 11 weeks gestation of pregnancy: Secondary | ICD-10-CM | POA: Insufficient documentation

## 2018-12-09 DIAGNOSIS — O26891 Other specified pregnancy related conditions, first trimester: Secondary | ICD-10-CM | POA: Diagnosis not present

## 2018-12-09 LAB — URINALYSIS, ROUTINE W REFLEX MICROSCOPIC
Bilirubin Urine: NEGATIVE
Glucose, UA: NEGATIVE mg/dL
Hgb urine dipstick: NEGATIVE
Ketones, ur: NEGATIVE mg/dL
Nitrite: NEGATIVE
Protein, ur: NEGATIVE mg/dL
Specific Gravity, Urine: 1.025 (ref 1.005–1.030)
pH: 8.5 — ABNORMAL HIGH (ref 5.0–8.0)

## 2018-12-09 LAB — CBC
HCT: 30.1 % — ABNORMAL LOW (ref 36.0–46.0)
Hemoglobin: 9.6 g/dL — ABNORMAL LOW (ref 12.0–15.0)
MCH: 24.7 pg — ABNORMAL LOW (ref 26.0–34.0)
MCHC: 31.9 g/dL (ref 30.0–36.0)
MCV: 77.4 fL — AB (ref 80.0–100.0)
Platelets: 134 10*3/uL — ABNORMAL LOW (ref 150–400)
RBC: 3.89 MIL/uL (ref 3.87–5.11)
RDW: 14.1 % (ref 11.5–15.5)
WBC: 5.7 10*3/uL (ref 4.0–10.5)
nRBC: 0 % (ref 0.0–0.2)

## 2018-12-09 LAB — URINALYSIS, MICROSCOPIC (REFLEX): RBC / HPF: NONE SEEN RBC/hpf (ref 0–5)

## 2018-12-09 LAB — POCT PREGNANCY, URINE: Preg Test, Ur: POSITIVE — AB

## 2018-12-09 MED ORDER — MAGNESIUM OXIDE 400 (241.3 MG) MG PO TABS
400.0000 mg | ORAL_TABLET | Freq: Once | ORAL | Status: AC
  Administered 2018-12-09: 400 mg via ORAL
  Filled 2018-12-09: qty 1

## 2018-12-09 MED ORDER — ACETAMINOPHEN 500 MG PO TABS
1000.0000 mg | ORAL_TABLET | Freq: Once | ORAL | Status: AC
  Administered 2018-12-09: 1000 mg via ORAL
  Filled 2018-12-09: qty 2

## 2018-12-09 NOTE — MAU Note (Signed)
Desiree Rice is a 28 y.o. here in MAU reporting: has been feeling lightheaded and having blurry vision since this AM. States she is not having these symptoms currently. Also having headaches. Pt reports she is about [redacted] weeks pregnant  LMP: 09/17/18  Onset of complaint: this morning  Pain score: 4/10  Vitals:   12/09/18 1726  BP: 115/74  Pulse: 90  Resp: 18  Temp: 98.3 F (36.8 C)  SpO2: 99%      HKV:[id number] but unable to obtain  Lab orders placed from triage: UA, UPT

## 2018-12-09 NOTE — MAU Note (Signed)
Pt is a G4P2 at 12.5 weeks c/o dizziness and headache today.  No vomiting but does c/o nausea.

## 2018-12-09 NOTE — MAU Provider Note (Addendum)
History     CSN: [id number]  Arrival date and time: 12/09/18 1705   First Provider Initiated Contact with Patient 12/09/18 1904      Chief Complaint  Patient presents with  . Headache   HPI  Ms.  Desiree Rice is a 28 y.o. year old G57P2012 female at [redacted]w[redacted]d weeks gestation who presents to MAU reporting feeling intermittent light-headed, dizzy, blurry vision since early this AM, H/A (rated 4/10), and on-going nausea that she take Zofran 4 mg occasionally. She has not taken any medication for her sx's today.   Past Medical History:  Diagnosis Date  . Anemia   . Chlamydia     Past Surgical History:  Procedure Laterality Date  . TIBIA FRACTURE SURGERY      Family History  Problem Relation Age of Onset  . Alcohol abuse Neg Hx     Social History   Tobacco Use  . Smoking status: Never Smoker  . Smokeless tobacco: Never Used  Substance Use Topics  . Alcohol use: Yes    Comment: occ  . Drug use: No    Allergies: No Known Allergies  Medications Prior to Admission  Medication Sig Dispense Refill Last Dose  . ibuprofen (ADVIL,MOTRIN) 800 MG tablet Take 1 tablet (800 mg total) by mouth every 8 (eight) hours as needed. 21 tablet 0   . Prenatal Vit-Fe Fumarate-FA (PRENATAL MULTIVITAMIN) TABS tablet Take 1 tablet by mouth daily at 12 noon.   04/12/2014 at Unknown time    Review of Systems  Constitutional: Negative.   HENT: Negative.   Eyes: Positive for visual disturbance (blurry vision).  Respiratory: Negative.   Cardiovascular: Negative.   Gastrointestinal: Positive for nausea (not a new problem).  Endocrine: Negative.   Genitourinary: Negative.   Musculoskeletal: Negative.   Skin: Negative.   Allergic/Immunologic: Negative.   Neurological: Positive for dizziness, light-headedness and headaches (4/10).  Hematological: Negative.   Psychiatric/Behavioral: Negative.    Physical Exam   Patient Vitals for the past 24 hrs:  BP Temp Temp src Pulse Resp SpO2 Height  Weight  12/09/18 1750 109/73 - - 92 - - - -  12/09/18 1748 117/72 - - 82 - - - -  12/09/18 1739 118/66 - - 84 - - - -  12/09/18 1726 115/74 98.3 F (36.8 C) Oral 90 18 99 % - -  12/09/18 1718 - - - - - - 5\' 10"  (1.778 m) 90.8 kg  *Orthostatic VS @ 1739, 1748 & 1750  Physical Exam  Nursing note and vitals reviewed. Constitutional: She is oriented to person, place, and time. She appears well-developed and well-nourished.  HENT:  Head: Normocephalic and atraumatic.  Eyes: Pupils are equal, round, and reactive to light.  Neck: Normal range of motion.  Cardiovascular: Normal rate.  Respiratory: Effort normal.  GI: Soft.  Genitourinary:    Genitourinary Comments: Pelvic deferred   Musculoskeletal: Normal range of motion.  Neurological: She is alert and oriented to person, place, and time.  Skin: Skin is warm and dry.  Psychiatric: She has a normal mood and affect. Her behavior is normal. Judgment and thought content normal.    MAU Course  Procedures  MDM CCUA Orthostatic VS CBC Tylenol 1000 mg   Results for orders placed or performed during the hospital encounter of 12/09/18 (from the past 24 hour(s))  Urinalysis, Routine w reflex microscopic     Status: Abnormal   Collection Time: 12/09/18  5:34 PM  Result Value Ref Range   Color,  Urine YELLOW YELLOW   APPearance CLEAR CLEAR   Specific Gravity, Urine 1.025 1.005 - 1.030   pH 8.5 (H) 5.0 - 8.0   Glucose, UA NEGATIVE NEGATIVE mg/dL   Hgb urine dipstick NEGATIVE NEGATIVE   Bilirubin Urine NEGATIVE NEGATIVE   Ketones, ur NEGATIVE NEGATIVE mg/dL   Protein, ur NEGATIVE NEGATIVE mg/dL   Nitrite NEGATIVE NEGATIVE   Leukocytes,Ua SMALL (A) NEGATIVE  Urinalysis, Microscopic (reflex)     Status: Abnormal   Collection Time: 12/09/18  5:34 PM  Result Value Ref Range   RBC / HPF NONE SEEN 0 - 5 RBC/hpf   WBC, UA 0-5 0 - 5 WBC/hpf   Bacteria, UA FEW (A) NONE SEEN   Squamous Epithelial / LPF 11-20 0 - 5   Mucus PRESENT    Pregnancy, urine POC     Status: Abnormal   Collection Time: 12/09/18  5:36 PM  Result Value Ref Range   Preg Test, Ur POSITIVE (A) NEGATIVE  CBC     Status: Abnormal   Collection Time: 12/09/18  7:25 PM  Result Value Ref Range   WBC 5.7 4.0 - 10.5 K/uL   RBC 3.89 3.87 - 5.11 MIL/uL   Hemoglobin 9.6 (L) 12.0 - 15.0 g/dL   HCT 19.5 (L) 09.3 - 26.7 %   MCV 77.4 (L) 80.0 - 100.0 fL   MCH 24.7 (L) 26.0 - 34.0 pg   MCHC 31.9 30.0 - 36.0 g/dL   RDW 12.4 58.0 - 99.8 %   Platelets 134 (L) 150 - 400 K/uL   nRBC 0.0 0.0 - 0.2 %   Report given to and care assumed by Cleone Slim, CNM @ [address] MSN, CNM 12/09/2018, 7:05 PM  Patient denies any symptoms or pain at this time. Lengthy discussion of iron supplements for anemia. Patient states she has known anemia with supplements at home and can start them today.   Assessment and Plan    1. Pregnancy headache in first trimester   2. [redacted] weeks gestation of pregnancy    -Discharge home in stable condition -First trimester precautions discussed -Patient advised to follow-up with OB in [facility] as scheduled for prenatal care on 01/03/2019 -Patient may return to MAU as needed or if her condition were to change or worsen  Rolm Bookbinder, [place] 12/09/18 8:17 PM

## 2018-12-09 NOTE — Discharge Instructions (Signed)

## 2019-09-16 ENCOUNTER — Encounter (HOSPITAL_COMMUNITY): Payer: Self-pay

## 2020-07-20 ENCOUNTER — Emergency Department (HOSPITAL_BASED_OUTPATIENT_CLINIC_OR_DEPARTMENT_OTHER)
Admission: EM | Admit: 2020-07-20 | Discharge: 2020-07-21 | Disposition: A | Discharge status: Home / Self Care | Payer: Medicaid Other | Attending: Emergency Medicine | Admitting: Emergency Medicine

## 2020-07-20 ENCOUNTER — Other Ambulatory Visit: Payer: Self-pay

## 2020-07-20 ENCOUNTER — Encounter (HOSPITAL_BASED_OUTPATIENT_CLINIC_OR_DEPARTMENT_OTHER): Payer: Self-pay | Admitting: *Deleted

## 2020-07-20 DIAGNOSIS — J029 Acute pharyngitis, unspecified: Secondary | ICD-10-CM | POA: Insufficient documentation

## 2020-07-20 DIAGNOSIS — R070 Pain in throat: Secondary | ICD-10-CM | POA: Diagnosis present

## 2020-07-20 LAB — GROUP A STREP BY PCR: Group A Strep by PCR: NOT DETECTED

## 2020-07-20 NOTE — ED Triage Notes (Signed)
C/o sore throat  , ha/ and ear pain x x 1 day

## 2020-07-21 MED ORDER — DEXAMETHASONE 10 MG/ML FOR PEDIATRIC ORAL USE
10.0000 mg | Freq: Once | INTRAMUSCULAR | Status: AC
  Administered 2020-07-21: 10 mg via ORAL
  Filled 2020-07-21: qty 1

## 2020-07-21 MED ORDER — KETOROLAC TROMETHAMINE 60 MG/2ML IM SOLN
30.0000 mg | Freq: Once | INTRAMUSCULAR | Status: AC
  Administered 2020-07-21: 30 mg via INTRAMUSCULAR
  Filled 2020-07-21: qty 2

## 2020-07-21 NOTE — ED Provider Notes (Signed)
[facility] EMERGENCY DEPARTMENT Provider Note   CSN: [id number] Arrival date & time: 07/20/20  2117     History Chief Complaint  Patient presents with  . Sore Throat    Desiree Rice is a 29 y.o. female.   Sore Throat This is a recurrent problem. The current episode started 2 days ago. The problem occurs constantly. The problem has been gradually improving. Pertinent negatives include no chest pain, no abdominal pain, no headaches and no shortness of breath. Nothing aggravates the symptoms. Nothing relieves the symptoms. She has tried nothing for the symptoms. The treatment provided no relief.       Past Medical History:  Diagnosis Date  . Anemia   . Chlamydia     There are no problems to display for this patient.   Past Surgical History:  Procedure Laterality Date  . TIBIA FRACTURE SURGERY       OB History    Gravida  4   Para  2   Term  2   Preterm  0   AB  1   Living  2     SAB  1   TAB  0   Ectopic  0   Multiple  0   Live Births  2           Family History  Problem Relation Age of Onset  . Alcohol abuse Neg Hx     Social History   Tobacco Use  . Smoking status: Never Smoker  . Smokeless tobacco: Never Used  Substance Use Topics  . Alcohol use: Yes    Comment: occ  . Drug use: No    Home Medications Prior to Admission medications   Medication Sig Start Date End Date Taking? Authorizing Provider  Prenatal Vit-Fe Fumarate-FA (PRENATAL MULTIVITAMIN) TABS tablet Take 1 tablet by mouth daily at 12 noon.    [provider]    Allergies    Patient has no known allergies.  Review of Systems   Review of Systems  Respiratory: Negative for shortness of breath.   Cardiovascular: Negative for chest pain.  Gastrointestinal: Negative for abdominal pain.  Neurological: Negative for headaches.  All other systems reviewed and are negative.   Physical Exam Updated Vital Signs BP 106/88 (BP Location: Right Arm)    Pulse 78   Temp 98.6 F (37 C) (Oral)   Resp 16   Ht 5\' 10"  (1.778 m)   Wt 93 kg   LMP 07/14/2020   SpO2 100%   BMI 29.41 kg/m   Physical Exam Vitals and nursing note reviewed.  Constitutional:      Appearance: She is well-developed.  HENT:     Head: Normocephalic and atraumatic.     Mouth/Throat:     Mouth: Mucous membranes are moist. No oral lesions.     Pharynx: Posterior oropharyngeal erythema (mild posterior) present. No pharyngeal swelling, oropharyngeal exudate or uvula swelling.  Cardiovascular:     Rate and Rhythm: Normal rate and regular rhythm.  Pulmonary:     Effort: No respiratory distress.     Breath sounds: No stridor.  Abdominal:     General: There is no distension.  Musculoskeletal:     Cervical back: Normal range of motion.  Skin:    General: Skin is warm and dry.  Neurological:     Mental Status: She is alert.     ED Results / Procedures / Treatments   Labs (all labs ordered are listed, but only abnormal  results are displayed) Labs Reviewed  GROUP A STREP BY PCR    EKG None  Radiology No results found.  Procedures Procedures (including critical care time)  Medications Ordered in ED Medications  dexamethasone (DECADRON) 10 MG/ML injection for Pediatric ORAL use 10 mg (10 mg Oral Given 07/21/20 0058)  ketorolac (TORADOL) injection 30 mg (30 mg Intramuscular Given 07/21/20 0059)    ED Course  I have reviewed the triage vital signs and the nursing notes.  Pertinent labs & imaging results that were available during my care of the patient were reviewed by me and considered in my medical decision making (see chart for details).    MDM Rules/Calculators/A&P                          Viral pharyngitis most likely. No evidence of peritonsillar abscess, strep throat, retropharyngeal abscess, epiglottitis or other emergent condition causing the patient's sore throat. We'll give a dose of Decadron here and suggest supportive care at home with  PCP follow-up if not improving. Will return here if any new breathing abnormalities or worsening of her symptoms.  Final Clinical Impression(s) / ED Diagnoses Final diagnoses:  Pharyngitis, unspecified etiology    Rx / DC Orders ED Discharge Orders    None       Corena Tilson, Barbara Cower, MD 07/21/20 [phone]

## 2024-05-22 ENCOUNTER — Encounter: Payer: Self-pay | Admitting: General Practice

## 2024-05-31 ENCOUNTER — Ambulatory Visit (INDEPENDENT_AMBULATORY_CARE_PROVIDER_SITE_OTHER): Admitting: Family

## 2024-05-31 ENCOUNTER — Encounter: Payer: Self-pay | Admitting: Family

## 2024-05-31 VITALS — BP 109/76 | HR 78 | Temp 98.1°F | Resp 16 | Ht 69.0 in | Wt 206.8 lb

## 2024-05-31 DIAGNOSIS — R635 Abnormal weight gain: Secondary | ICD-10-CM

## 2024-05-31 DIAGNOSIS — F418 Other specified anxiety disorders: Secondary | ICD-10-CM | POA: Diagnosis not present

## 2024-05-31 DIAGNOSIS — Z6830 Body mass index (BMI) 30.0-30.9, adult: Secondary | ICD-10-CM

## 2024-05-31 DIAGNOSIS — Z7689 Persons encountering health services in other specified circumstances: Secondary | ICD-10-CM

## 2024-05-31 DIAGNOSIS — F32A Depression, unspecified: Secondary | ICD-10-CM

## 2024-05-31 MED ORDER — HYDROXYZINE PAMOATE 25 MG PO CAPS: 25.0000 mg | ORAL_CAPSULE | Freq: Three times a day (TID) | ORAL | 0 refills | Status: DC | PRN

## 2024-05-31 MED ORDER — PHENTERMINE HCL 15 MG PO CAPS
15.0000 mg | ORAL_CAPSULE | ORAL | 0 refills | Status: DC
Start: 2024-05-31 — End: 2024-07-08

## 2024-05-31 NOTE — Progress Notes (Signed)
 Subjective:    Desiree Rice - 33 y.o. female MRN [id number]  Date of birth: 1991/05/26  HPI  Desiree Rice is to establish care.  Current issues and/or concerns: - Weight gain. States she wants to be able to move around more. States she has been eating more and she does not exercise. However, states she plans to begin exercising soon and recently purchased a treadmill. She would like to try a weight loss pill.  - Anxiety depression. States she is a single mother to 3 children once of which has autism. States she noticed recently she has been more snappy. States in the past she was prescribed anxiety depression medication and never took it because she felt like the former provider was trying to force her to take medications and did not explain how the medications work. She is ready to begin anxiety depression medication and would like referral to preferably an African American therapist. She denies thoughts of self-harm, suicidal ideations, homicidal ideations.  ROS per HPI   Health Maintenance:  Health Maintenance Due  Topic Date Due   HIV Screening  Never done   Hepatitis C Screening  Never done   Cervical Cancer Screening (HPV/Pap Cotest)  11/15/2023   INFLUENZA VACCINE  05/24/2024     Past Medical History: There are no active problems to display for this patient.     Social History   reports that she has never smoked. She has never used smokeless tobacco. She reports current alcohol use. She reports that she does not use drugs.   Family History  family history is not on file.   Medications: reviewed and updated   Objective:   Physical Exam BP 109/76   Pulse 78   Temp 98.1 F (36.7 C) (Oral)   Resp 16   Ht 5' 9 (1.753 m)   Wt 206 lb 12.8 oz (93.8 kg)   LMP 05/21/2024   SpO2 95%   Breastfeeding No   BMI 30.54 kg/m   Wt Readings from Last 3 Encounters:  05/31/24 206 lb 12.8 oz (93.8 kg)  07/20/20 205 lb (93 kg)  12/09/18 200 lb 4 oz (90.8 kg)     Physical Exam HENT:     Head: Normocephalic and atraumatic.     Nose: Nose normal.     Mouth/Throat:     Mouth: Mucous membranes are moist.     Pharynx: Oropharynx is clear.  Eyes:     Extraocular Movements: Extraocular movements intact.     Conjunctiva/sclera: Conjunctivae normal.     Pupils: Pupils are equal, round, and reactive to light.  Cardiovascular:     Rate and Rhythm: Normal rate and regular rhythm.     Pulses: Normal pulses.     Heart sounds: Normal heart sounds.  Pulmonary:     Effort: Pulmonary effort is normal.     Breath sounds: Normal breath sounds.  Musculoskeletal:        General: Normal range of motion.     Cervical back: Normal range of motion and neck supple.  Neurological:     General: No focal deficit present.     Mental Status: She is alert and oriented to person, place, and time.  Psychiatric:        Mood and Affect: Mood normal.        Behavior: Behavior normal.       Assessment & Plan:  1. Encounter to establish care (Primary) - Patient presents today to establish care. During the interim  follow-up with primary provider as scheduled.  - Return for annual physical examination, labs, and health maintenance. Arrive fasting meaning having no food for at least 8 hours prior to appointment. You may have only water or black coffee. Please take scheduled medications as normal.  2. Encounter for weight management 3. BMI 30.0-30.9,adult 4. Weight gain - Phentermine  as prescribed. Counseled on medication adherence and adverse effects.  - I did check the [place]  prescription drug database. - Counseled on low-sodium DASH diet and 150 minutes of moderate intensity exercise per week as tolerated to assist with weight management.  - Patient declined referral to Medical Weight Management. - Follow-up with primary provider in 4 weeks or sooner if needed. - phentermine  15 MG capsule; Take 1 capsule (15 mg total) by mouth every morning.  Dispense: 30  capsule; Refill: 0  5. Anxiety and depression - Patient denies thoughts of self-harm, suicidal ideations, homicidal ideations. - Trial Hydroxyzine  as prescribed. Counseled on medication adherence/adverse effects.  - Referral to Psychiatry for evaluation/management.  - Follow-up with primary provider as scheduled. - Ambulatory referral to Psychiatry - hydrOXYzine  (VISTARIL ) 25 MG capsule; Take 1 capsule (25 mg total) by mouth every 8 (eight) hours as needed.  Dispense: 90 capsule; Refill: 0   Patient was given clear instructions to go to Emergency Department or return to medical center if symptoms don't improve, worsen, or new problems develop.The patient verbalized understanding.  I discussed the assessment and treatment plan with the patient. The patient was provided an opportunity to ask questions and all were answered. The patient agreed with the plan and demonstrated an understanding of the instructions.   The patient was advised to call back or seek an in-person evaluation if the symptoms worsen or if the condition fails to improve as anticipated.    Greig Drones, NP 05/31/2024, 8:43 AM Primary Care at [facility]

## 2024-05-31 NOTE — Progress Notes (Signed)
 Weight loss , anxiety, patient patient scored a 9 on the PHQ-9,

## 2024-06-23 ENCOUNTER — Other Ambulatory Visit: Payer: Self-pay | Admitting: Family

## 2024-06-23 DIAGNOSIS — F32A Depression, unspecified: Secondary | ICD-10-CM

## 2024-06-25 NOTE — Telephone Encounter (Signed)
 Too soon for refill LRF 05/31/24 for 30 and 1 RF.  Requested Prescriptions  Pending Prescriptions Disp Refills   hydrOXYzine  (VISTARIL ) 25 MG capsule [Pharmacy Med Name: HYDROXYZINE  PAM 25 MG CAP] 270 capsule 1    Sig: TAKE 1 CAPSULE (25 MG TOTAL) BY MOUTH EVERY 8 (EIGHT) HOURS AS NEEDED.     Ear, Nose, and Throat:  Antihistamines 2 Failed - 06/25/2024 12:57 PM      Failed - Cr in normal range and within 360 days    Creatinine, Ser  Date Value Ref Range Status  02/24/2014 0.70 0.50 - 1.10 mg/dL Final         Passed - Valid encounter within last 12 months    Recent Outpatient Visits           3 weeks ago Encounter to establish care   [facility] at [facility], [place], NP       Future Appointments             In 1 week Lorren Greig PARAS, NP [facility] at [facility], [address]

## 2024-07-08 ENCOUNTER — Ambulatory Visit (INDEPENDENT_AMBULATORY_CARE_PROVIDER_SITE_OTHER): Admitting: Family

## 2024-07-08 ENCOUNTER — Encounter: Payer: Self-pay | Admitting: Family

## 2024-07-08 VITALS — BP 112/79 | HR 95 | Temp 98.2°F | Resp 16 | Ht 69.0 in | Wt 199.2 lb

## 2024-07-08 DIAGNOSIS — R635 Abnormal weight gain: Secondary | ICD-10-CM | POA: Diagnosis not present

## 2024-07-08 DIAGNOSIS — Z1159 Encounter for screening for other viral diseases: Secondary | ICD-10-CM

## 2024-07-08 DIAGNOSIS — Z1329 Encounter for screening for other suspected endocrine disorder: Secondary | ICD-10-CM

## 2024-07-08 DIAGNOSIS — Z130 Encounter for screening for diseases of the blood and blood-forming organs and certain disorders involving the immune mechanism: Secondary | ICD-10-CM

## 2024-07-08 DIAGNOSIS — Z13228 Encounter for screening for other metabolic disorders: Secondary | ICD-10-CM

## 2024-07-08 DIAGNOSIS — Z0000 Encounter for general adult medical examination without abnormal findings: Secondary | ICD-10-CM | POA: Diagnosis not present

## 2024-07-08 DIAGNOSIS — F32A Depression, unspecified: Secondary | ICD-10-CM

## 2024-07-08 DIAGNOSIS — Z13220 Encounter for screening for lipoid disorders: Secondary | ICD-10-CM

## 2024-07-08 DIAGNOSIS — Z7689 Persons encountering health services in other specified circumstances: Secondary | ICD-10-CM

## 2024-07-08 DIAGNOSIS — Z5320 Procedure and treatment not carried out because of patient's decision for unspecified reasons: Secondary | ICD-10-CM

## 2024-07-08 DIAGNOSIS — F419 Anxiety disorder, unspecified: Secondary | ICD-10-CM

## 2024-07-08 DIAGNOSIS — Z6829 Body mass index (BMI) 29.0-29.9, adult: Secondary | ICD-10-CM | POA: Diagnosis not present

## 2024-07-08 DIAGNOSIS — Z114 Encounter for screening for human immunodeficiency virus [HIV]: Secondary | ICD-10-CM

## 2024-07-08 DIAGNOSIS — Z131 Encounter for screening for diabetes mellitus: Secondary | ICD-10-CM

## 2024-07-08 MED ORDER — HYDROXYZINE PAMOATE 25 MG PO CAPS: 25.0000 mg | ORAL_CAPSULE | Freq: Three times a day (TID) | ORAL | 1 refills | Status: DC | PRN

## 2024-07-08 MED ORDER — PHENTERMINE HCL 30 MG PO CAPS: 30.0000 mg | ORAL_CAPSULE | ORAL | 0 refills | Status: DC

## 2024-07-08 NOTE — Progress Notes (Signed)
 Patient ID: Desiree Rice, female    DOB: Feb 21, 1991  MRN: [id number]  CC: Annual Exam  Subjective: Desiree Rice is a 33 y.o. female who presents for annual exam.   Her concerns today include:  - Established with Gynecology for women's health screenings/maintenance.  - Doing well on Phentermine , no issues/concerns. She is watching what she eats. She plans to begin exercising soon. States she never received call from Medical Weight Management. - Doing well on Hydroxyzine , no issues/concerns. States she never received call from Psychiatry however, she has established with a therapist and does not need new referral to Psychiatry. She denies thoughts of self-harm, suicidal ideations, homicidal ideations.   There are no active problems to display for this patient.    Current Outpatient Medications on File Prior to Visit  Medication Sig Dispense Refill   acidophilus (RISAQUAD) CAPS capsule Take 1 capsule by mouth daily.     ferrous sulfate 300 (60 Fe) MG/5ML syrup Take 300 mg by mouth daily.     Multiple Vitamins-Minerals (MULTIVITAMIN WITH MINERALS) tablet Take 1 tablet by mouth daily.     cholecalciferol (VITAMIN D3) 25 MCG (1000 UNIT) tablet Take 10,000 Units by mouth once a week. (Patient taking differently: Take 10,000 Units by mouth once a week. Patient MD want her to take 10,000 3 times a week for 8 weeks)     Prenatal Vit-Fe Fumarate-FA (PRENATAL MULTIVITAMIN) TABS tablet Take 1 tablet by mouth daily at 12 noon. (Patient not taking: Reported on 05/31/2024)     No current facility-administered medications on file prior to visit.    No Known Allergies  Social History   Socioeconomic History   Marital status: Single    Spouse name: Not on file   Number of children: Not on file   Years of education: Not on file   Highest education level: Not on file  Occupational History   Not on file  Tobacco Use   Smoking status: Never   Smokeless tobacco: Never  Vaping Use   Vaping  status: Never Used  Substance and Sexual Activity   Alcohol use: Yes    Comment: occ   Drug use: No   Sexual activity: Yes    Birth control/protection: None, I.U.D.  Other Topics Concern   Not on file  Social History Narrative   Not on file   Social Drivers of Health   Financial Resource Strain: Low Risk  (07/08/2024)   Overall Financial Resource Strain (CARDIA)    Difficulty of Paying Living Expenses: Not hard at all  Food Insecurity: Low Risk  (05/01/2024)   Received from [facility]   Hunger Vital Sign    Within the past 12 months, you worried that your food would run out before you got money to buy more: Never true    Within the past 12 months, the food you bought just didn't last and you didn't have money to get more. : Never true  Transportation Needs: No Transportation Needs (05/01/2024)   Received from [facility]    In the past 12 months, has lack of reliable transportation kept you from medical appointments, meetings, work or from getting things needed for daily living? : No  Physical Activity: Inactive (07/08/2024)   Exercise Vital Sign    Days of Exercise per Week: 0 days    Minutes of Exercise per Session: 0 min  Stress: No Stress Concern Present (07/08/2024)   [employer] of Occupational Health - Occupational Stress  Questionnaire    Feeling of Stress: Not at all  Social Connections: Moderately Isolated (05/31/2024)   Social Connection and Isolation Panel    Frequency of Communication with Friends and Family: More than three times a week    Frequency of Social Gatherings with Friends and Family: More than three times a week    Attends Religious Services: 1 to 4 times per year    Active Member of [employer] or Organizations: No    Attends Banker Meetings: Never    Marital Status: Never married  Intimate Partner Violence: Not At Risk (05/31/2024)   Humiliation, Afraid, Rape, and Kick questionnaire    Fear of Current or Ex-Partner: No     Emotionally Abused: No    Physically Abused: No    Sexually Abused: No    Family History  Problem Relation Age of Onset   Alcohol abuse Neg Hx     Past Surgical History:  Procedure Laterality Date   TIBIA FRACTURE SURGERY      ROS: Review of Systems Negative except as stated above  PHYSICAL EXAM: BP 112/79   Pulse 95   Temp 98.2 F (36.8 C) (Oral)   Resp 16   Ht 5' 9 (1.753 m)   Wt 199 lb 3.2 oz (90.4 kg)   LMP 07/05/2024 (Exact Date)   SpO2 99%   BMI 29.42 kg/m   Wt Readings from Last 3 Encounters:  07/08/24 199 lb 3.2 oz (90.4 kg)  05/31/24 206 lb 12.8 oz (93.8 kg)  07/20/20 205 lb (93 kg)   Physical Exam HENT:     Head: Normocephalic and atraumatic.     Right Ear: Tympanic membrane, ear canal and external ear normal.     Left Ear: Tympanic membrane, ear canal and external ear normal.     Nose: Nose normal.     Mouth/Throat:     Mouth: Mucous membranes are moist.     Pharynx: Oropharynx is clear.  Eyes:     Extraocular Movements: Extraocular movements intact.     Conjunctiva/sclera: Conjunctivae normal.     Pupils: Pupils are equal, round, and reactive to light.  Neck:     Thyroid: No thyroid mass, thyromegaly or thyroid tenderness.  Cardiovascular:     Rate and Rhythm: Normal rate and regular rhythm.     Pulses: Normal pulses.     Heart sounds: Normal heart sounds.  Pulmonary:     Effort: Pulmonary effort is normal.     Breath sounds: Normal breath sounds.  Chest:     Comments: Patient declined. Abdominal:     General: Bowel sounds are normal.     Palpations: Abdomen is soft.  Genitourinary:    Comments: Patient declined. Musculoskeletal:        General: Normal range of motion.     Right shoulder: Normal.     Left shoulder: Normal.     Right upper arm: Normal.     Left upper arm: Normal.     Right elbow: Normal.     Left elbow: Normal.     Right forearm: Normal.     Left forearm: Normal.     Right wrist: Normal.     Left wrist: Normal.      Right hand: Normal.     Left hand: Normal.     Cervical back: Normal, normal range of motion and neck supple.     Thoracic back: Normal.     Lumbar back: Normal.     Right  hip: Normal.     Left hip: Normal.     Right upper leg: Normal.     Left upper leg: Normal.     Right knee: Normal.     Left knee: Normal.     Right lower leg: Normal.     Left lower leg: Normal.     Right ankle: Normal.     Left ankle: Normal.     Right foot: Normal.     Left foot: Normal.  Skin:    General: Skin is warm and dry.     Capillary Refill: Capillary refill takes less than 2 seconds.  Neurological:     General: No focal deficit present.     Mental Status: She is alert and oriented to person, place, and time.  Psychiatric:        Mood and Affect: Mood normal.        Behavior: Behavior normal.    ASSESSMENT AND PLAN: 1. Annual physical exam (Primary) - Counseled on 150 minutes of exercise per week as tolerated, healthy eating (including decreased daily intake of saturated fats, cholesterol, added sugars, sodium), STI prevention, and routine healthcare maintenance.  2. Screening for metabolic disorder - Routine screening.  - CMP14+EGFR  3. Screening for deficiency anemia - Routine screening.  - CBC  4. Diabetes mellitus screening - Routine screening.  - Hemoglobin A1c  5. Screening for cholesterol level - Routine screening.  - Lipid panel  6. Thyroid disorder screen - Routine screening.  - TSH  7. Encounter for screening for HIV - Routine screening.  - HIV antibody (with reflex)  8. Need for hepatitis C screening test - Routine screening.  - Hepatitis C Antibody  9. Cervical cancer screening declined - Patient declined.   10. Encounter for weight management 11. BMI 29.0-29.9,adult 12. Weight gain - Patient lost 7 pounds since previous office visit.  - Increase Phentermine  from 15 mg to 30 mg as prescribed. Counseled on medication adherence/adverse effects.  -  Referral to Medical Weight Management for evaluation/management.  - Follow-up with primary provider as scheduled.  - phentermine  30 MG capsule; Take 1 capsule (30 mg total) by mouth every morning.  Dispense: 30 capsule; Refill: 0 - Amb Ref to Medical Weight Management  13. Anxiety and depression - Patient denies thoughts of self-harm, suicidal ideations, homicidal ideations. - Continue Hydroxyzine  as prescribed. Counseled on medication adherence/adverse effects.  - Keep all scheduled appointments with established therapist.  - Follow-up with primary provider as scheduled. - hydrOXYzine  (VISTARIL ) 25 MG capsule; Take 1 capsule (25 mg total) by mouth every 8 (eight) hours as needed.  Dispense: 90 capsule; Refill: 1   Patient was given the opportunity to ask questions.  Patient verbalized understanding of the plan and was able to repeat key elements of the plan. Patient was given clear instructions to go to Emergency Department or return to medical center if symptoms don't improve, worsen, or new problems develop.The patient verbalized understanding.   Orders Placed This Encounter  Procedures   CBC   Lipid panel   CMP14+EGFR   Hemoglobin A1c   TSH   HIV antibody (with reflex)   Hepatitis C Antibody   Amb Ref to Medical Weight Management     Requested Prescriptions   Signed Prescriptions Disp Refills   phentermine  30 MG capsule 30 capsule 0    Sig: Take 1 capsule (30 mg total) by mouth every morning.   hydrOXYzine  (VISTARIL ) 25 MG capsule 90 capsule 1  Sig: Take 1 capsule (25 mg total) by mouth every 8 (eight) hours as needed.    Return in about 1 year (around 07/08/2025) for Physical per patient preference.  Greig JINNY Drones, NP

## 2024-07-11 ENCOUNTER — Ambulatory Visit: Payer: Self-pay | Admitting: Family

## 2024-07-11 DIAGNOSIS — Z13220 Encounter for screening for lipoid disorders: Secondary | ICD-10-CM

## 2024-07-11 LAB — CBC
Hematocrit: 37.8 % (ref 34.0–46.6)
Hemoglobin: 11.6 g/dL (ref 11.1–15.9)
MCH: 24.8 pg — ABNORMAL LOW (ref 26.6–33.0)
MCHC: 30.7 g/dL — ABNORMAL LOW (ref 31.5–35.7)
MCV: 81 fL (ref 79–97)
Platelets: 198 x10E3/uL (ref 150–450)
RBC: 4.67 x10E6/uL (ref 3.77–5.28)
RDW: 13.2 % (ref 11.7–15.4)
WBC: 4.3 x10E3/uL (ref 3.4–10.8)

## 2024-07-11 LAB — CMP14+EGFR
ALT: 28 IU/L (ref 0–32)
AST: 19 IU/L (ref 0–40)
Albumin: 3.9 g/dL (ref 3.9–4.9)
Alkaline Phosphatase: 34 IU/L — ABNORMAL LOW (ref 41–116)
BUN/Creatinine Ratio: 10 (ref 9–23)
BUN: 8 mg/dL (ref 6–20)
Bilirubin Total: 0.4 mg/dL (ref 0.0–1.2)
CO2: 20 mmol/L (ref 20–29)
Calcium: 8.9 mg/dL (ref 8.7–10.2)
Chloride: 105 mmol/L (ref 96–106)
Creatinine, Ser: 0.77 mg/dL (ref 0.57–1.00)
Globulin, Total: 2.8 g/dL (ref 1.5–4.5)
Glucose: 77 mg/dL (ref 70–99)
Potassium: 4.1 mmol/L (ref 3.5–5.2)
Sodium: 139 mmol/L (ref 134–144)
Total Protein: 6.7 g/dL (ref 6.0–8.5)
eGFR: 103 mL/min/1.73 (ref >59)

## 2024-07-11 LAB — HEMOGLOBIN A1C
Est. average glucose Bld gHb Est-mCnc: 105 mg/dL
Hgb A1c MFr Bld: 5.3 % (ref 4.8–5.6)

## 2024-07-11 LAB — LIPID PANEL
Chol/HDL Ratio: 3.1 ratio (ref 0.0–4.4)
Cholesterol, Total: 179 mg/dL (ref 100–199)
HDL: 58 mg/dL (ref >39)
LDL Chol Calc (NIH): 104 mg/dL — ABNORMAL HIGH (ref 0–99)
Triglycerides: 93 mg/dL (ref 0–149)
VLDL Cholesterol Cal: 17 mg/dL (ref 5–40)

## 2024-07-11 LAB — TSH: TSH: 1.45 u[IU]/mL (ref 0.450–4.500)

## 2024-07-11 LAB — HEPATITIS C ANTIBODY: Hep C Virus Ab: NONREACTIVE

## 2024-07-11 LAB — HIV ANTIBODY (ROUTINE TESTING W REFLEX): HIV Screen 4th Generation wRfx: NONREACTIVE

## 2024-07-17 ENCOUNTER — Telehealth: Payer: Self-pay | Admitting: Emergency Medicine

## 2024-07-17 NOTE — Telephone Encounter (Signed)
 Copied from CRM #[id number]. Topic: Clinical - Medical Advice >> Jul 17, 2024  2:15 PM Yolanda T wrote: Reason for CRM: patient would like a call back to advise if Amy Lorren does pap smears and full STI panels.  I called patient ad made her aware that Ms. Lorren does not pap smears and STI panels then I transferred her to the front desk to all on.

## 2024-07-18 ENCOUNTER — Telehealth: Payer: Self-pay | Admitting: Emergency Medicine

## 2024-07-18 NOTE — Telephone Encounter (Signed)
 Copied from CRM #[id number]. Topic: Appointments - Scheduling Inquiry for Clinic >> Jul 18, 2024  9:10 AM Antony RAMAN wrote: Reason for CRM: PATIENT CALLED AND REQUESTED THAT SHE ALSO GETS A FULL STD PANEL AND PAP SMEAR FOR HER APPT ON 08/12/2024 @ 8:30AM  I returned patient call no one answered so I left a voicemail to return my call.

## 2024-07-31 ENCOUNTER — Other Ambulatory Visit: Payer: Self-pay | Admitting: Family

## 2024-07-31 DIAGNOSIS — F32A Depression, unspecified: Secondary | ICD-10-CM

## 2024-08-12 ENCOUNTER — Other Ambulatory Visit

## 2024-08-16 ENCOUNTER — Other Ambulatory Visit: Payer: Self-pay | Admitting: Family

## 2024-08-16 ENCOUNTER — Other Ambulatory Visit (HOSPITAL_COMMUNITY)
Admission: RE | Admit: 2024-08-16 | Discharge: 2024-08-16 | Disposition: A | Discharge status: Home / Self Care | Source: Ambulatory Visit | Attending: Family | Admitting: Family

## 2024-08-16 ENCOUNTER — Encounter: Payer: Self-pay | Admitting: Family

## 2024-08-16 ENCOUNTER — Other Ambulatory Visit

## 2024-08-16 DIAGNOSIS — Z113 Encounter for screening for infections with a predominantly sexual mode of transmission: Secondary | ICD-10-CM

## 2024-08-16 DIAGNOSIS — Z13220 Encounter for screening for lipoid disorders: Secondary | ICD-10-CM

## 2024-08-17 LAB — LIPID PANEL
Chol/HDL Ratio: 2.6 ratio (ref 0.0–4.4)
Cholesterol, Total: 172 mg/dL (ref 100–199)
HDL: 66 mg/dL (ref >39)
LDL Chol Calc (NIH): 92 mg/dL (ref 0–99)
Triglycerides: 72 mg/dL (ref 0–149)
VLDL Cholesterol Cal: 14 mg/dL (ref 5–40)

## 2024-08-19 ENCOUNTER — Ambulatory Visit: Payer: Self-pay | Admitting: Family

## 2024-08-19 ENCOUNTER — Telehealth: Payer: Self-pay | Admitting: Emergency Medicine

## 2024-08-19 DIAGNOSIS — R399 Unspecified symptoms and signs involving the genitourinary system: Secondary | ICD-10-CM

## 2024-08-19 NOTE — Telephone Encounter (Signed)
 Schedule appointment?

## 2024-08-19 NOTE — Telephone Encounter (Unsigned)
 Copied from CRM [id number]. Topic: Clinical - Lab/Test Results >> Aug 19, 2024 11:38 AM Selinda RAMAN wrote: Reason for CRM: The patient called in returning a call regarding her lab results. I read what the provider stated regarding her lipid panel being normal and she was appreciative. She would like call back when the other test she had done results are back. Please assist patient further

## 2024-08-20 LAB — CERVICOVAGINAL ANCILLARY ONLY
Comment: NEGATIVE
Comment: NEGATIVE
Comment: NEGATIVE
Comment: NEGATIVE
Comment: NEGATIVE
Comment: NORMAL

## 2024-08-20 NOTE — Progress Notes (Signed)
 Pt scheduled

## 2024-08-30 ENCOUNTER — Telehealth: Payer: Self-pay

## 2024-08-30 NOTE — Telephone Encounter (Signed)
 Does pt need a separate appt for TB testing?

## 2024-08-30 NOTE — Telephone Encounter (Signed)
 Copied from CRM #[id number]. Topic: Appointments - Scheduling Inquiry for Clinic >> Aug 30, 2024  1:55 PM Desiree Rice wrote: Reason for CRM:  Patient would like to schedule TB testing on the same day as her upcoming office visit with Greig Chute, NP, which is scheduled for 11/11 at 2:40 PM. The patient can be contacted at [phone]

## 2024-08-30 NOTE — Telephone Encounter (Signed)
 I returned patient call no one answered so I left a voicemail to return my call.  Yes the patient can get a TB test the same day as appointment

## 2024-09-03 ENCOUNTER — Other Ambulatory Visit (HOSPITAL_COMMUNITY)
Admission: RE | Admit: 2024-09-03 | Discharge: 2024-09-03 | Disposition: A | Discharge status: Home / Self Care | Source: Ambulatory Visit | Attending: Family | Admitting: Family

## 2024-09-03 ENCOUNTER — Ambulatory Visit: Admitting: Family

## 2024-09-03 VITALS — BP 115/83 | HR 82 | Temp 98.0°F | Resp 16 | Ht 69.0 in | Wt 187.4 lb

## 2024-09-03 DIAGNOSIS — Z111 Encounter for screening for respiratory tuberculosis: Secondary | ICD-10-CM | POA: Diagnosis not present

## 2024-09-03 DIAGNOSIS — R399 Unspecified symptoms and signs involving the genitourinary system: Secondary | ICD-10-CM

## 2024-09-03 DIAGNOSIS — Z113 Encounter for screening for infections with a predominantly sexual mode of transmission: Secondary | ICD-10-CM

## 2024-09-03 DIAGNOSIS — R635 Abnormal weight gain: Secondary | ICD-10-CM | POA: Diagnosis not present

## 2024-09-03 MED ORDER — PHENTERMINE HCL 37.5 MG PO CAPS: 37.5000 mg | ORAL_CAPSULE | ORAL | 0 refills | Status: DC

## 2024-09-03 NOTE — Progress Notes (Signed)
 Patient ID: Desiree Rice, female    DOB: 10/05/1991  MRN: [id number]  CC: Weight Check  Subjective: Desiree Rice is a 33 y.o. female who presents for weight check.  Her concerns today include:  - Doing well on Phentermine , no issues/concerns. She is watching what she eats. She does not exercise outside of normal routine. States weight specialist would not see her because her BMI is not high enough.  - Repeat vaginal swab.  - Needs TB skin test for work.  There are no active problems to display for this patient.    Current Outpatient Medications on File Prior to Visit  Medication Sig Dispense Refill   hydrOXYzine  (VISTARIL ) 25 MG capsule TAKE 1 CAPSULE (25 MG TOTAL) BY MOUTH EVERY 8 (EIGHT) HOURS AS NEEDED. 270 capsule 0   Multiple Vitamins-Minerals (MULTIVITAMIN WITH MINERALS) tablet Take 1 tablet by mouth daily.     acidophilus (RISAQUAD) CAPS capsule Take 1 capsule by mouth daily.     cholecalciferol (VITAMIN D3) 25 MCG (1000 UNIT) tablet Take 10,000 Units by mouth once a week. (Patient taking differently: Take 10,000 Units by mouth once a week. Patient MD want her to take 10,000 3 times a week for 8 weeks)     ferrous sulfate 300 (60 Fe) MG/5ML syrup Take 300 mg by mouth daily.     Prenatal Vit-Fe Fumarate-FA (PRENATAL MULTIVITAMIN) TABS tablet Take 1 tablet by mouth daily at 12 noon. (Patient not taking: Reported on 05/31/2024)     No current facility-administered medications on file prior to visit.    No Known Allergies  Social History   Socioeconomic History   Marital status: Single    Spouse name: Not on file   Number of children: Not on file   Years of education: Not on file   Highest education level: Associate degree: occupational, scientist, product/process development, or vocational program  Occupational History   Not on file  Tobacco Use   Smoking status: Never   Smokeless tobacco: Never  Vaping Use   Vaping status: Never Used  Substance and Sexual Activity   Alcohol use: Yes     Comment: occ   Drug use: No   Sexual activity: Yes    Birth control/protection: None, I.U.D.  Other Topics Concern   Not on file  Social History Narrative   Not on file   Social Drivers of Health   Financial Resource Strain: Low Risk  (09/03/2024)   Overall Financial Resource Strain (CARDIA)    Difficulty of Paying Living Expenses: Not hard at all  Food Insecurity: No Food Insecurity (09/03/2024)   Hunger Vital Sign    Worried About Running Out of Food in the Last Year: Never true    Ran Out of Food in the Last Year: Never true  Transportation Needs: No Transportation Needs (09/03/2024)   PRAPARE - Administrator, Civil Service (Medical): No    Lack of Transportation (Non-Medical): No  Physical Activity: Insufficiently Active (09/03/2024)   Exercise Vital Sign    Days of Exercise per Week: 2 days    Minutes of Exercise per Session: 10 min  Stress: No Stress Concern Present (09/03/2024)   [employer] of Occupational Health - Occupational Stress Questionnaire    Feeling of Stress: Only a little  Social Connections: Socially Isolated (09/03/2024)   Social Connection and Isolation Panel    Frequency of Communication with Friends and Family: More than three times a week    Frequency of Social Gatherings with  Friends and Family: More than three times a week    Attends Religious Services: Patient declined    Active Member of Clubs or Organizations: No    Attends Engineer, Structural: Not on file    Marital Status: Never married  Intimate Partner Violence: Not At Risk (05/31/2024)   Humiliation, Afraid, Rape, and Kick questionnaire    Fear of Current or Ex-Partner: No    Emotionally Abused: No    Physically Abused: No    Sexually Abused: No    Family History  Problem Relation Age of Onset   Alcohol abuse Neg Hx     Past Surgical History:  Procedure Laterality Date   TIBIA FRACTURE SURGERY      ROS: Review of Systems Negative except as stated  above  PHYSICAL EXAM: BP 115/83   Pulse 82   Temp 98 F (36.7 C) (Oral)   Resp 16   Ht 5' 9 (1.753 m)   Wt 187 lb 6.4 oz (85 kg)   LMP 09/01/2024 (Exact Date)   SpO2 99%   BMI 27.67 kg/m   Wt Readings from Last 3 Encounters:  09/03/24 187 lb 6.4 oz (85 kg)  07/08/24 199 lb 3.2 oz (90.4 kg)  05/31/24 206 lb 12.8 oz (93.8 kg)   Physical Exam HENT:     Head: Normocephalic and atraumatic.     Nose: Nose normal.     Mouth/Throat:     Mouth: Mucous membranes are moist.     Pharynx: Oropharynx is clear.  Eyes:     Extraocular Movements: Extraocular movements intact.     Conjunctiva/sclera: Conjunctivae normal.     Pupils: Pupils are equal, round, and reactive to light.  Cardiovascular:     Rate and Rhythm: Normal rate and regular rhythm.     Pulses: Normal pulses.     Heart sounds: Normal heart sounds.  Pulmonary:     Effort: Pulmonary effort is normal.     Breath sounds: Normal breath sounds.  Musculoskeletal:        General: Normal range of motion.     Cervical back: Normal range of motion and neck supple.  Neurological:     General: No focal deficit present.     Mental Status: She is alert and oriented to person, place, and time.  Psychiatric:        Mood and Affect: Mood normal.        Behavior: Behavior normal.     ASSESSMENT AND PLAN: 1. Weight gain (Primary) - Patient lost 12 pounds since previous office visit.  - Increase Phentermine  from 30 mg to 37.5 mg as prescribed. Counseled on medication adherence/adverse effects.  - I did check the [place]  prescription drug database.  - Follow-up with primary provider in 4 weeks or sooner if needed.  - phentermine  37.5 MG capsule; Take 1 capsule (37.5 mg total) by mouth every morning.  Dispense: 30 capsule; Refill: 0  2. Routine screening for STI (sexually transmitted infection) - Routine screening.  - Cervicovaginal ancillary only  3. Visit for TB skin test - TB skin test today in office. Return in 48 to  72 hours for CMA visit. - PPD   Patient was given the opportunity to ask questions.  Patient verbalized understanding of the plan and was able to repeat key elements of the plan. Patient was given clear instructions to go to Emergency Department or return to medical center if symptoms don't improve, worsen, or new problems develop.The patient verbalized  understanding.   Orders Placed This Encounter  Procedures   PPD     Requested Prescriptions   Signed Prescriptions Disp Refills   phentermine  37.5 MG capsule 30 capsule 0    Sig: Take 1 capsule (37.5 mg total) by mouth every morning.    Return in 4 weeks (on 10/01/2024) for Follow-Up or next available weight check.  Greig JINNY Chute, NP

## 2024-09-03 NOTE — Progress Notes (Signed)
 Tb test, vaginal swab, medication refill

## 2024-09-04 ENCOUNTER — Ambulatory Visit: Payer: Self-pay | Admitting: Family

## 2024-09-04 LAB — CERVICOVAGINAL ANCILLARY ONLY
Bacterial Vaginitis (gardnerella): NEGATIVE
Candida Glabrata: NEGATIVE
Candida Vaginitis: NEGATIVE
Chlamydia: NEGATIVE
Comment: NEGATIVE
Comment: NEGATIVE
Comment: NEGATIVE
Comment: NEGATIVE
Comment: NEGATIVE
Comment: NORMAL
Neisseria Gonorrhea: NEGATIVE
Trichomonas: NEGATIVE

## 2024-09-05 ENCOUNTER — Ambulatory Visit (INDEPENDENT_AMBULATORY_CARE_PROVIDER_SITE_OTHER): Admitting: Emergency Medicine

## 2024-09-05 LAB — TB SKIN TEST
Induration: 0 mm
TB Skin Test: NEGATIVE

## 2024-09-12 ENCOUNTER — Ambulatory Visit: Payer: Self-pay

## 2024-09-12 NOTE — Telephone Encounter (Signed)
 FYI Only or Action Required?: Action required by provider: update on patient condition. Requesting to be lowered back to 30mg  daily of phentermine - 37.5 to strong  Patient was last seen in primary care on 09/03/2024 by Jaycee Greig PARAS, NP.  Called Nurse Triage reporting Medication Reaction.  Symptoms began several days ago.  Interventions attempted: Rest, hydration, or home remedies.  Symptoms are: gradually improving.  Triage Disposition: Call PCP Within 24 Hours  Patient/caregiver understands and will follow disposition?: No, wishes to speak with PCP  Copied from CRM #[id number]. Topic: Clinical - Red Word Triage >> Sep 12, 2024 12:07 PM Harlene ORN wrote: Red Word that prompted transfer to Nurse Triage: was prescribed Sertraline 37.5 MG tablets.It's too strong for her and it's causing side effects after taking it for about a week. Would like for it to be reduced back down to 30 MG. Causing itchness in her face and nausea, and loss off appetite. Reason for Disposition  Taking prescription medication that could cause nausea (e.g., narcotics/opiates, antibiotics, OCPs, many others)  Answer Assessment - Initial Assessment Questions Itching to the face No appetite- severe nausea- went to sleep- Has been drinking water   Did not take today- itching stopped, and nausea has slowly subsided.  Patient asking if she can get prescribed back the 30mg  capsules of phentermine  instead of 37.5mg .   1. NAUSEA SEVERITY: How bad is the nausea? (e.g., mild, moderate, severe; dehydration, weight loss)     Severe- wasn't able to eat, had to force to drink 2. ONSET: When did the nausea begin?     A few days ago 3. VOMITING: Any vomiting? If Yes, ask: How many times today?     denies 4. RECURRENT SYMPTOM: Have you had nausea before? If Yes, ask: When was the last time? What happened that time?     denies 5. CAUSE: What do you think is causing the nausea?     Increased med dose 6.  PREGNANCY: Is there any chance you are pregnant? (e.g., unprotected intercourse, missed birth control pill, broken condom)     denies  Protocols used: Nausea-A-AH

## 2024-09-13 ENCOUNTER — Other Ambulatory Visit: Payer: Self-pay | Admitting: Family

## 2024-09-13 DIAGNOSIS — R635 Abnormal weight gain: Secondary | ICD-10-CM

## 2024-09-13 MED ORDER — PHENTERMINE HCL 30 MG PO CAPS: 30.0000 mg | ORAL_CAPSULE | ORAL | 0 refills | Status: AC

## 2024-09-13 NOTE — Telephone Encounter (Signed)
 Complete

## 2024-09-13 NOTE — Telephone Encounter (Signed)
 I called patient to make her aware that her prescription request was completed no one answered so I left a voicemail to return my call.

## 2024-09-16 NOTE — Telephone Encounter (Signed)
 I called patient to make her aware  that pcp completed her request and she stated she already picked it up

## 2024-09-26 ENCOUNTER — Ambulatory Visit: Admitting: Family

## 2024-10-21 ENCOUNTER — Encounter: Admitting: Family

## 2024-10-21 NOTE — Progress Notes (Signed)
 Erroneous encounter-disregard

## 2024-11-26 ENCOUNTER — Encounter: Admitting: Family

## 2024-11-26 NOTE — Progress Notes (Signed)
 Erroneous encounter-disregard

## 2024-12-25 ENCOUNTER — Ambulatory Visit: Admitting: Family

## 2025-07-08 ENCOUNTER — Encounter: Admitting: Family
